# Patient Record
Sex: Male | Born: 1945 | Race: White | Hispanic: No | Marital: Married | Smoking: Former smoker
Health system: Southern US, Community
[De-identification: ages and names within clinical notes are randomized; demographics above are authoritative.]

## PROBLEM LIST (undated history)

## (undated) DIAGNOSIS — Z794 Long term (current) use of insulin: Secondary | ICD-10-CM

## (undated) DIAGNOSIS — G4730 Sleep apnea, unspecified: Secondary | ICD-10-CM

## (undated) DIAGNOSIS — J449 Chronic obstructive pulmonary disease, unspecified: Secondary | ICD-10-CM

## (undated) DIAGNOSIS — E119 Type 2 diabetes mellitus without complications: Secondary | ICD-10-CM

## (undated) DIAGNOSIS — L0390 Cellulitis, unspecified: Secondary | ICD-10-CM

## (undated) DIAGNOSIS — N179 Acute kidney failure, unspecified: Secondary | ICD-10-CM

## (undated) DIAGNOSIS — I10 Essential (primary) hypertension: Secondary | ICD-10-CM

## (undated) DIAGNOSIS — N289 Disorder of kidney and ureter, unspecified: Secondary | ICD-10-CM

## (undated) DIAGNOSIS — Z77098 Contact with and (suspected) exposure to other hazardous, chiefly nonmedicinal, chemicals: Secondary | ICD-10-CM

## (undated) DIAGNOSIS — E785 Hyperlipidemia, unspecified: Secondary | ICD-10-CM

## (undated) HISTORY — DX: Long term (current) use of insulin: Z79.4

## (undated) HISTORY — DX: Type 2 diabetes mellitus without complications: E11.9

## (undated) HISTORY — DX: Contact with and (suspected) exposure to other hazardous, chiefly nonmedicinal, chemicals: Z77.098

## (undated) HISTORY — DX: Hyperlipidemia, unspecified: E78.5

## (undated) HISTORY — DX: Cellulitis, unspecified: L03.90

## (undated) HISTORY — PX: APPENDECTOMY: SHX54

---

## 2005-09-20 ENCOUNTER — Encounter: Admission: RE | Admit: 2005-09-20 | Discharge: 2005-09-20 | Payer: Self-pay | Admitting: Family Medicine

## 2006-02-17 ENCOUNTER — Ambulatory Visit: Payer: Self-pay | Admitting: Family Medicine

## 2006-03-12 ENCOUNTER — Ambulatory Visit: Payer: Self-pay | Admitting: Family Medicine

## 2006-03-26 ENCOUNTER — Ambulatory Visit: Payer: Self-pay | Admitting: Family Medicine

## 2006-07-30 ENCOUNTER — Ambulatory Visit: Payer: Self-pay | Admitting: Internal Medicine

## 2006-10-06 ENCOUNTER — Ambulatory Visit: Payer: Self-pay | Admitting: Family Medicine

## 2007-08-06 ENCOUNTER — Telehealth (INDEPENDENT_AMBULATORY_CARE_PROVIDER_SITE_OTHER): Payer: Self-pay | Admitting: *Deleted

## 2007-08-27 ENCOUNTER — Telehealth (INDEPENDENT_AMBULATORY_CARE_PROVIDER_SITE_OTHER): Payer: Self-pay | Admitting: *Deleted

## 2007-08-31 ENCOUNTER — Telehealth (INDEPENDENT_AMBULATORY_CARE_PROVIDER_SITE_OTHER): Payer: Self-pay | Admitting: *Deleted

## 2007-09-01 ENCOUNTER — Telehealth (INDEPENDENT_AMBULATORY_CARE_PROVIDER_SITE_OTHER): Payer: Self-pay | Admitting: *Deleted

## 2007-09-09 ENCOUNTER — Ambulatory Visit: Payer: Self-pay | Admitting: Family Medicine

## 2007-09-09 DIAGNOSIS — K219 Gastro-esophageal reflux disease without esophagitis: Secondary | ICD-10-CM

## 2007-09-09 DIAGNOSIS — J45909 Unspecified asthma, uncomplicated: Secondary | ICD-10-CM | POA: Insufficient documentation

## 2007-09-09 DIAGNOSIS — E119 Type 2 diabetes mellitus without complications: Secondary | ICD-10-CM

## 2007-09-09 DIAGNOSIS — E669 Obesity, unspecified: Secondary | ICD-10-CM

## 2007-09-09 DIAGNOSIS — E785 Hyperlipidemia, unspecified: Secondary | ICD-10-CM

## 2007-09-09 DIAGNOSIS — I10 Essential (primary) hypertension: Secondary | ICD-10-CM | POA: Insufficient documentation

## 2007-09-10 ENCOUNTER — Telehealth (INDEPENDENT_AMBULATORY_CARE_PROVIDER_SITE_OTHER): Payer: Self-pay | Admitting: *Deleted

## 2008-05-12 ENCOUNTER — Ambulatory Visit: Payer: Self-pay | Admitting: Internal Medicine

## 2008-05-12 DIAGNOSIS — L02219 Cutaneous abscess of trunk, unspecified: Secondary | ICD-10-CM | POA: Insufficient documentation

## 2008-05-12 DIAGNOSIS — D239 Other benign neoplasm of skin, unspecified: Secondary | ICD-10-CM | POA: Insufficient documentation

## 2008-05-12 DIAGNOSIS — L03319 Cellulitis of trunk, unspecified: Secondary | ICD-10-CM

## 2008-06-08 ENCOUNTER — Encounter: Payer: Self-pay | Admitting: Internal Medicine

## 2008-08-23 ENCOUNTER — Encounter: Payer: Self-pay | Admitting: Internal Medicine

## 2008-10-12 ENCOUNTER — Ambulatory Visit: Payer: Self-pay

## 2008-10-12 ENCOUNTER — Ambulatory Visit: Payer: Self-pay | Admitting: Family Medicine

## 2008-10-12 DIAGNOSIS — L03119 Cellulitis of unspecified part of limb: Secondary | ICD-10-CM | POA: Insufficient documentation

## 2008-10-14 ENCOUNTER — Ambulatory Visit: Payer: Self-pay | Admitting: Family Medicine

## 2008-10-15 ENCOUNTER — Ambulatory Visit: Payer: Self-pay | Admitting: Family Medicine

## 2008-10-17 ENCOUNTER — Ambulatory Visit: Payer: Self-pay | Admitting: Family Medicine

## 2008-10-19 ENCOUNTER — Ambulatory Visit: Payer: Self-pay | Admitting: Family Medicine

## 2008-10-20 ENCOUNTER — Telehealth (INDEPENDENT_AMBULATORY_CARE_PROVIDER_SITE_OTHER): Payer: Self-pay | Admitting: *Deleted

## 2008-10-22 ENCOUNTER — Telehealth (INDEPENDENT_AMBULATORY_CARE_PROVIDER_SITE_OTHER): Payer: Self-pay | Admitting: *Deleted

## 2008-10-22 ENCOUNTER — Emergency Department (HOSPITAL_BASED_OUTPATIENT_CLINIC_OR_DEPARTMENT_OTHER): Admission: EM | Admit: 2008-10-22 | Discharge: 2008-10-22 | Payer: Self-pay | Admitting: Emergency Medicine

## 2008-10-24 ENCOUNTER — Ambulatory Visit: Payer: Self-pay | Admitting: Family Medicine

## 2008-10-25 ENCOUNTER — Encounter (INDEPENDENT_AMBULATORY_CARE_PROVIDER_SITE_OTHER): Payer: Self-pay | Admitting: *Deleted

## 2008-10-25 ENCOUNTER — Ambulatory Visit: Payer: Self-pay | Admitting: Internal Medicine

## 2008-10-25 ENCOUNTER — Telehealth: Payer: Self-pay | Admitting: Family Medicine

## 2008-10-25 LAB — CONVERTED CEMR LAB: OCCULT 1: NEGATIVE

## 2008-10-27 ENCOUNTER — Ambulatory Visit: Payer: Self-pay | Admitting: Family Medicine

## 2008-10-31 ENCOUNTER — Encounter: Payer: Self-pay | Admitting: Family Medicine

## 2008-11-01 ENCOUNTER — Ambulatory Visit: Payer: Self-pay | Admitting: Family Medicine

## 2008-11-02 ENCOUNTER — Telehealth (INDEPENDENT_AMBULATORY_CARE_PROVIDER_SITE_OTHER): Payer: Self-pay | Admitting: *Deleted

## 2008-11-14 ENCOUNTER — Encounter (INDEPENDENT_AMBULATORY_CARE_PROVIDER_SITE_OTHER): Payer: Self-pay | Admitting: *Deleted

## 2008-11-14 ENCOUNTER — Ambulatory Visit: Payer: Self-pay | Admitting: Family Medicine

## 2008-11-14 DIAGNOSIS — D649 Anemia, unspecified: Secondary | ICD-10-CM | POA: Insufficient documentation

## 2008-11-16 ENCOUNTER — Encounter (INDEPENDENT_AMBULATORY_CARE_PROVIDER_SITE_OTHER): Payer: Self-pay | Admitting: *Deleted

## 2008-11-16 ENCOUNTER — Telehealth (INDEPENDENT_AMBULATORY_CARE_PROVIDER_SITE_OTHER): Payer: Self-pay | Admitting: *Deleted

## 2008-11-16 LAB — CONVERTED CEMR LAB
Basophils Absolute: 0 10*3/uL (ref 0.0–0.1)
Basophils Relative: 0.1 % (ref 0.0–3.0)
Eosinophils Relative: 13.8 % — ABNORMAL HIGH (ref 0.0–5.0)
Iron: 130 ug/dL (ref 42–165)
Lymphocytes Relative: 14.2 % (ref 12.0–46.0)
MCV: 87.8 fL (ref 78.0–100.0)
Monocytes Absolute: 0.6 10*3/uL (ref 0.1–1.0)
Neutrophils Relative %: 61 % (ref 43.0–77.0)
RBC: 4.68 M/uL (ref 4.22–5.81)
Saturation Ratios: 33.8 % (ref 20.0–50.0)
WBC: 5.2 10*3/uL (ref 4.5–10.5)

## 2008-11-23 ENCOUNTER — Ambulatory Visit: Payer: Self-pay | Admitting: Family Medicine

## 2008-12-05 ENCOUNTER — Encounter: Payer: Self-pay | Admitting: Family Medicine

## 2008-12-08 ENCOUNTER — Ambulatory Visit (HOSPITAL_BASED_OUTPATIENT_CLINIC_OR_DEPARTMENT_OTHER): Admission: RE | Admit: 2008-12-08 | Discharge: 2008-12-08 | Payer: Self-pay | Admitting: Family Medicine

## 2008-12-08 ENCOUNTER — Ambulatory Visit: Payer: Self-pay | Admitting: Radiology

## 2008-12-08 ENCOUNTER — Telehealth (INDEPENDENT_AMBULATORY_CARE_PROVIDER_SITE_OTHER): Payer: Self-pay | Admitting: *Deleted

## 2008-12-08 ENCOUNTER — Ambulatory Visit: Payer: Self-pay | Admitting: Family Medicine

## 2008-12-08 DIAGNOSIS — J209 Acute bronchitis, unspecified: Secondary | ICD-10-CM | POA: Insufficient documentation

## 2008-12-15 ENCOUNTER — Ambulatory Visit: Payer: Self-pay | Admitting: Family Medicine

## 2008-12-15 DIAGNOSIS — R609 Edema, unspecified: Secondary | ICD-10-CM

## 2008-12-19 ENCOUNTER — Telehealth (INDEPENDENT_AMBULATORY_CARE_PROVIDER_SITE_OTHER): Payer: Self-pay | Admitting: *Deleted

## 2008-12-19 LAB — CONVERTED CEMR LAB
BUN: 30 mg/dL — ABNORMAL HIGH (ref 6–23)
Chloride: 101 meq/L (ref 96–112)
Glucose, Bld: 141 mg/dL — ABNORMAL HIGH (ref 70–99)

## 2008-12-22 ENCOUNTER — Encounter: Payer: Self-pay | Admitting: Family Medicine

## 2008-12-27 ENCOUNTER — Telehealth: Payer: Self-pay | Admitting: Family Medicine

## 2009-01-09 ENCOUNTER — Encounter: Payer: Self-pay | Admitting: Family Medicine

## 2009-01-09 ENCOUNTER — Encounter (HOSPITAL_BASED_OUTPATIENT_CLINIC_OR_DEPARTMENT_OTHER): Admission: RE | Admit: 2009-01-09 | Discharge: 2009-02-07 | Payer: Self-pay | Admitting: Internal Medicine

## 2009-01-10 ENCOUNTER — Encounter: Payer: Self-pay | Admitting: Family Medicine

## 2009-01-12 ENCOUNTER — Encounter: Payer: Self-pay | Admitting: Family Medicine

## 2009-01-18 ENCOUNTER — Encounter: Payer: Self-pay | Admitting: Family Medicine

## 2009-01-24 ENCOUNTER — Encounter: Payer: Self-pay | Admitting: Family Medicine

## 2009-02-06 ENCOUNTER — Telehealth: Payer: Self-pay | Admitting: Family Medicine

## 2009-02-15 ENCOUNTER — Ambulatory Visit: Payer: Self-pay | Admitting: Family Medicine

## 2009-02-15 LAB — CONVERTED CEMR LAB
Blood in Urine, dipstick: NEGATIVE
Glucose, Urine, Semiquant: >=1000
Ketones, urine, test strip: NEGATIVE
Specific Gravity, Urine: 1.015
WBC Urine, dipstick: NEGATIVE
pH: 6

## 2009-02-20 ENCOUNTER — Telehealth (INDEPENDENT_AMBULATORY_CARE_PROVIDER_SITE_OTHER): Payer: Self-pay | Admitting: *Deleted

## 2009-02-27 ENCOUNTER — Telehealth (INDEPENDENT_AMBULATORY_CARE_PROVIDER_SITE_OTHER): Payer: Self-pay | Admitting: *Deleted

## 2009-03-01 ENCOUNTER — Telehealth: Payer: Self-pay | Admitting: Family Medicine

## 2009-03-08 ENCOUNTER — Ambulatory Visit: Payer: Self-pay | Admitting: Family Medicine

## 2009-03-13 ENCOUNTER — Telehealth: Payer: Self-pay | Admitting: Family Medicine

## 2009-03-14 LAB — CONVERTED CEMR LAB
AST: 18 units/L (ref 0–37)
Albumin: 3.7 g/dL (ref 3.5–5.2)
BUN: 25 mg/dL — ABNORMAL HIGH (ref 6–23)
Bilirubin, Direct: 0.1 mg/dL (ref 0.0–0.3)
Calcium: 9.6 mg/dL (ref 8.4–10.5)
Creatinine,U: 116.3 mg/dL
HDL: 33 mg/dL — ABNORMAL LOW (ref >39.00)
Hgb A1c MFr Bld: 11.2 % — ABNORMAL HIGH (ref 4.6–6.5)
Microalb, Ur: 2.6 mg/dL — ABNORMAL HIGH (ref 0.0–1.9)
Total CHOL/HDL Ratio: 4
VLDL: 23 mg/dL (ref 0.0–40.0)

## 2009-03-27 ENCOUNTER — Encounter (INDEPENDENT_AMBULATORY_CARE_PROVIDER_SITE_OTHER): Payer: Self-pay | Admitting: *Deleted

## 2009-07-04 ENCOUNTER — Ambulatory Visit: Payer: Self-pay | Admitting: Family Medicine

## 2009-10-20 ENCOUNTER — Ambulatory Visit: Payer: Self-pay | Admitting: Family Medicine

## 2009-10-23 ENCOUNTER — Telehealth: Payer: Self-pay | Admitting: Family Medicine

## 2009-12-15 ENCOUNTER — Ambulatory Visit: Payer: Self-pay | Admitting: Family Medicine

## 2009-12-15 LAB — CONVERTED CEMR LAB: Glucose, Bld: 79 mg/dL

## 2010-01-20 IMAGING — CR DG TIBIA/FIBULA 2V*L*
3 series · 3 of 3 positions shown · non-contrast
Comparison: None

CLINICAL DATA: Left lower leg pain

LEFT TIBIA AND FIBULA - 2 VIEW

[view not recorded (1 of 3)]
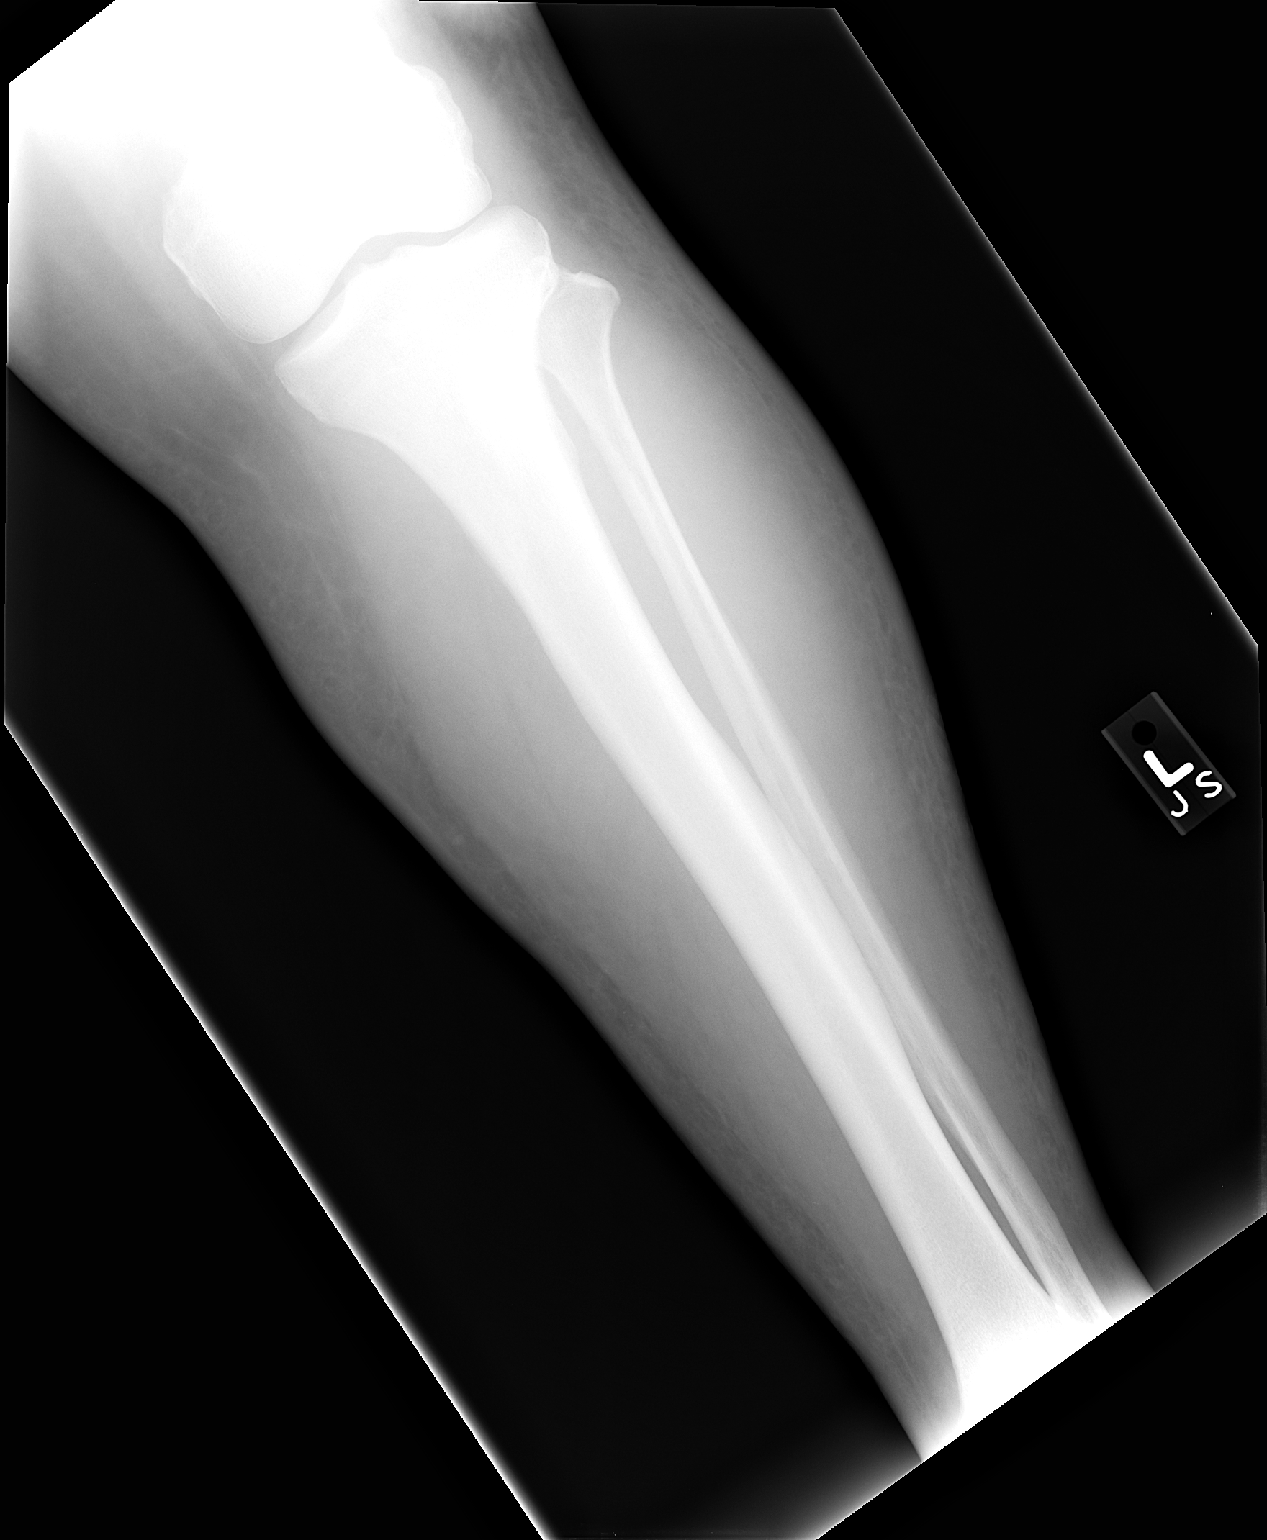

[view not recorded (2 of 3)]
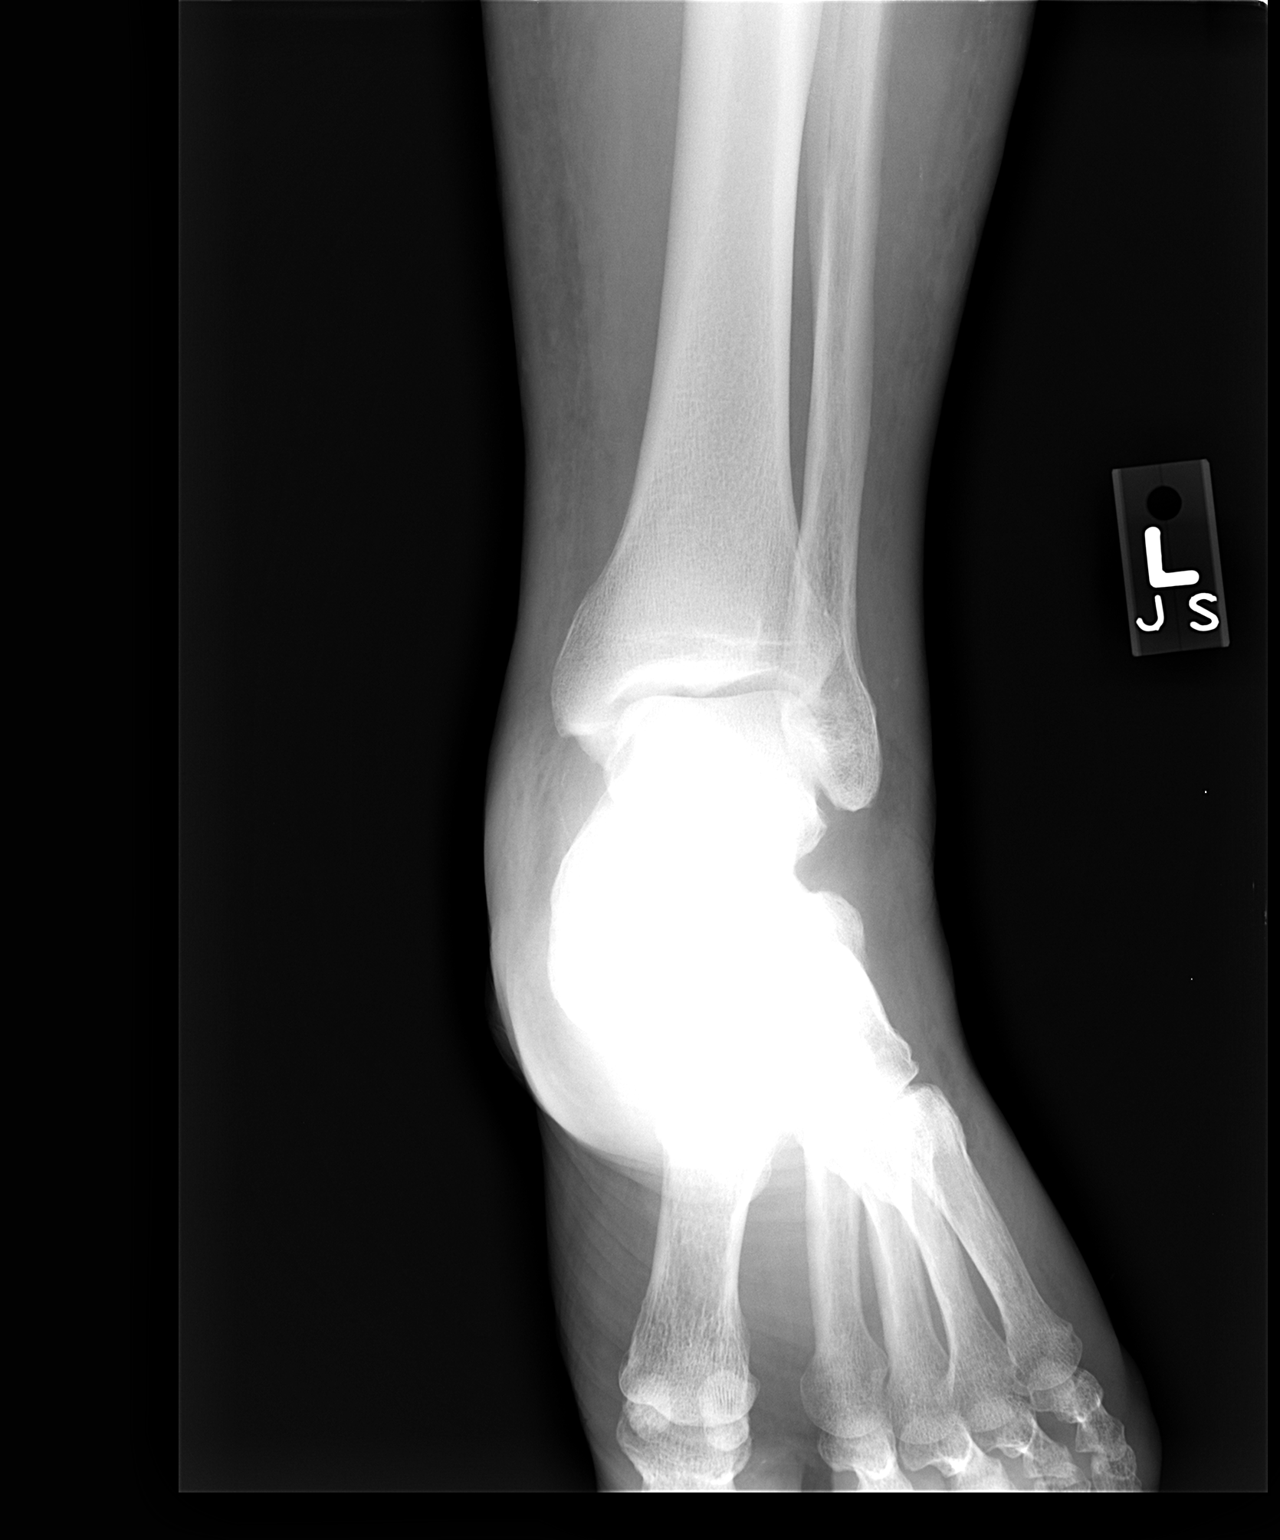

[view not recorded (3 of 3)]
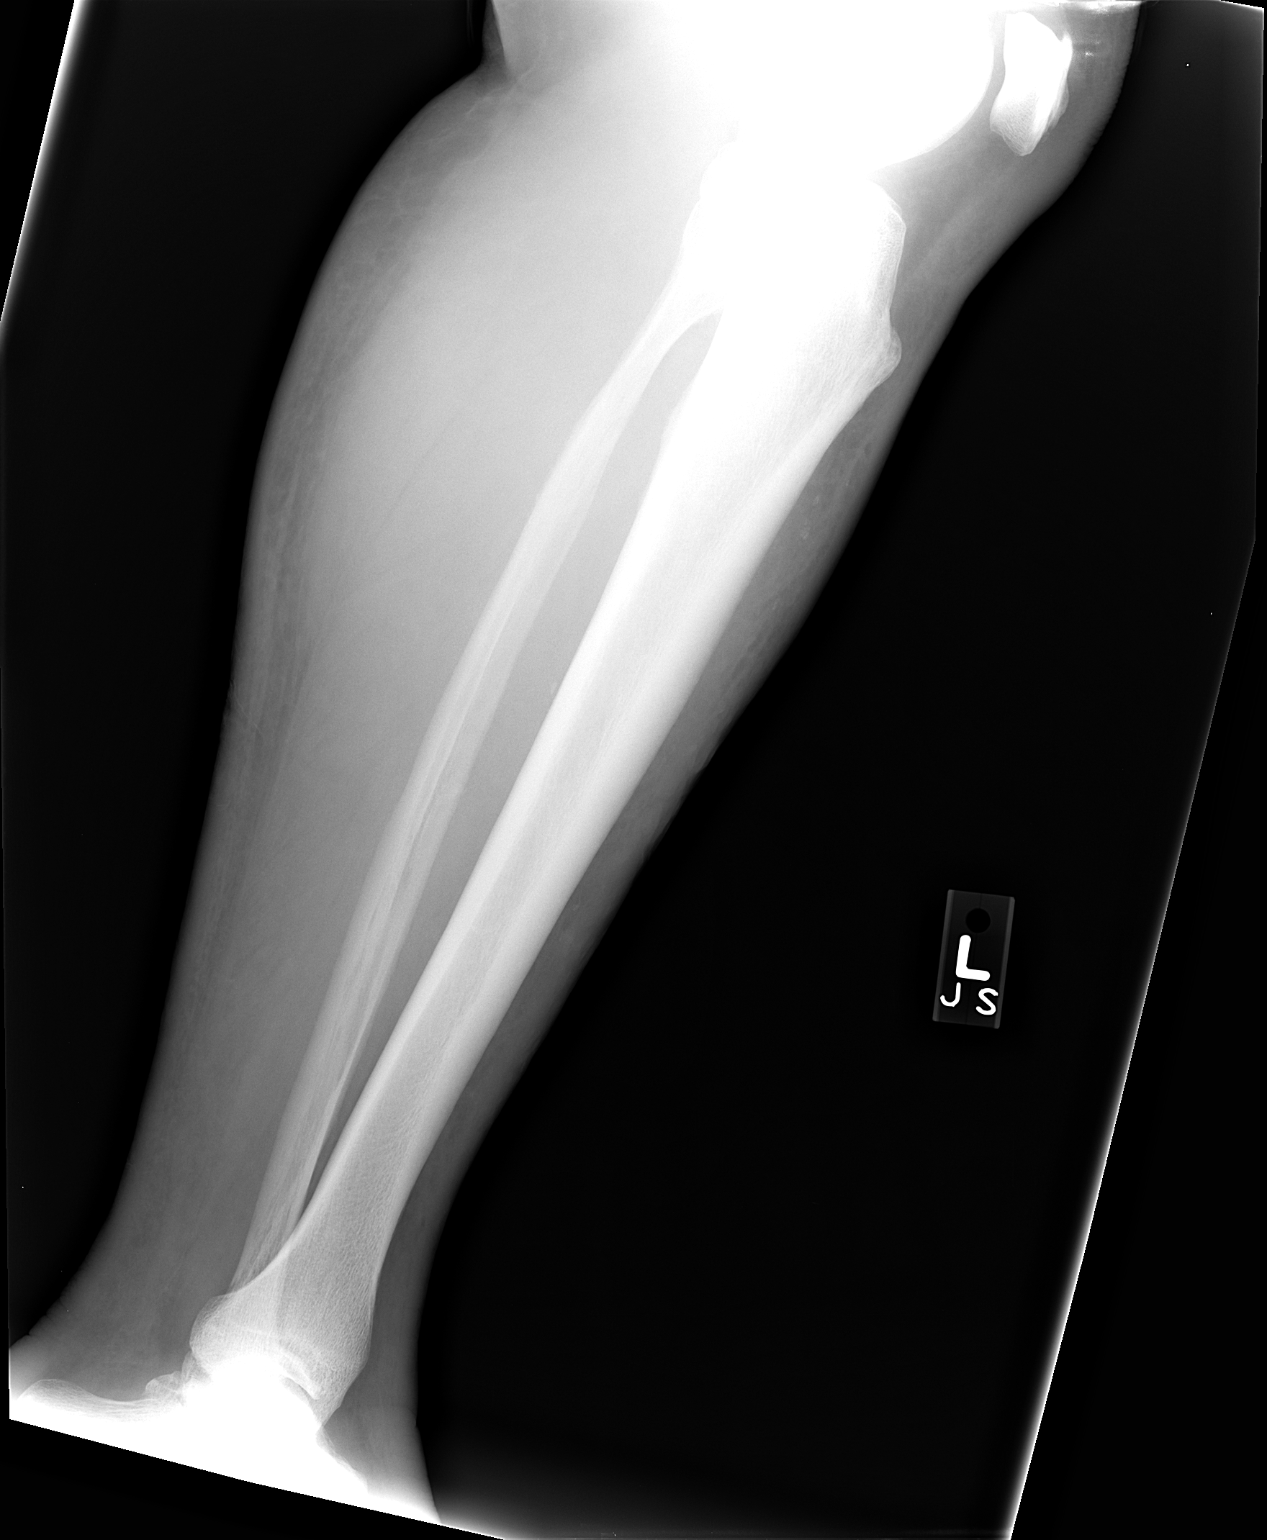

[3 of 3 positions shown; findings below may reference images not displayed]

FINDINGS: No acute bony abnormality is seen.  There is some soft
tissue swelling particularly over the anterior proximal left tibia.
Alignment is normal.
IMPRESSION: No bony abnormality.  Soft tissue swelling is noted above.

## 2010-08-28 NOTE — Progress Notes (Signed)
Summary: refill  Phone Note Refill Request Message from:  Fax from Pharmacy on October 23, 2009 4:50 PM  Refills Requested: Medication #1:  CHERATUSSIN AC 100-10 MG/5ML SYRP 1-2 tsp by mouth at bedtime as needed [address] fax [phone]   Method Requested: Fax to Local Pharmacy Next Appointment Scheduled: no appt Initial call taken by: Barb Merino,  October 23, 2009 4:51 PM  Follow-up for Phone Call        Pt was seen on 10/20/09 for a bad cough, he had meds at home but he states he is out now. Army Fossa CMA  October 24, 2009 8:11 AM   Additional Follow-up for Phone Call Additional follow up Details #1::        ok to refill x1 Additional Follow-up by: Loreen Freud DO,  October 24, 2009 10:52 AM    Prescriptions: CHERATUSSIN AC 100-10 MG/5ML SYRP (GUAIFENESIN-CODEINE) 1-2 tsp by mouth at bedtime as needed  #6 oz x 0   Entered by:   Army Fossa CMA   Authorized by:   Loreen Freud DO   Signed by:   Army Fossa CMA on 10/24/2009   Method used:   Printed then faxed to ...       [facility] (retail)       [address]       [place], [place]  [address]       Ph: [phone]       Fax: [phone]   RxID:   [id number]

## 2010-08-28 NOTE — Assessment & Plan Note (Signed)
Summary: sore on back--acute only--tl   Vital Signs:  Patient Profile:   65 Years Old Male Weight:      259 pounds Temp:     97.5 degrees F oral Pulse rate:   60 / minute Resp:     16 per minute BP sitting:   118 / 70  (left arm) Cuff size:   large  Pt. in pain?   no  Vitals Entered By: Shonna Chock (May 12, 2008 1:23 PM)                  Chief Complaint:  Sore itchy area on back x 3-4 weeks .  History of Present Illness: No trigger or injury as prelude to skin lesion which presented as pruritis. No PMH of MRSA. 50 # weight loss with Optifast through Dr Lenora Boys, [place] [place].His [facility] 8[facility]& was 5.8%. Metformin D/Ced 2 weeks ago; still on Glipizide & generic Avandia. No PMH of melanoma or FH of skin CA. On partial disability for DM & glaucoma ? due to Agent [employer] Emery ([place], [place] [id number])    Current Allergies (reviewed today): No known allergies     Risk Factors:  Tobacco use:  quit    Year quit:  age 34    Type:  beer Exercise:  yes    Times per week:  3    Type:  Walking    Physical Exam  General:     in no acute distress; alert,appropriate and cooperative throughout examination;overweight-appearing.   Skin:     Myriad of nevi ;excoriated ,mildly inflammed lesion L upper back Cervical Nodes:     No lymphadenopathy noted Axillary Nodes:     No palpable lymphadenopathy    Impression & Recommendations:  Problem # 1:  CELLULITIS, TRUNK (ICD-682.2) R/O squamous cell or basal cell CA with superficial infection Orders: Dermatology Referral (Derma)   Problem # 2:  NEVI, MULTIPLE (ICD-216.9)  Orders: Dermatology Referral (Derma)   Complete Medication List: 1)  Toprol Xl 50 Mg Xr24h-tab (Metoprolol succinate) .... 1/2 by mouth two times a day 2)  Felodipine 10 Mg Xr24h-tab (Felodipine) .Marland Kitchen.. 1 by mouth once daily 3)  Lisinopril 10 Mg Tabs (Lisinopril) .... 1/2 by mouth once daily 4)  Furosemide 20 Mg Tabs (Furosemide) .Marland Kitchen.. 1 by mouth  once daily 5)  Simvastatin 80 Mg Tabs (Simvastatin) .... 1/2 by mouth once daily 6)  Avandia 8 Mg Tabs (Rosiglitazone maleate) .Marland Kitchen.. 1 by mouth once daily 7)  Glipizide 5 Mg Tabs (Glipizide) .Marland Kitchen.. 1 by mouth once daily 8)  Bactroban 2 % Crea (Mupirocin calcium) .... Apply two times a day post cleansing   Patient Instructions: 1)  Meds as directed; keep Derm appt.   Prescriptions: BACTROBAN 2 % CREA (MUPIROCIN CALCIUM) apply two times a day post cleansing  #15 grams x 0   Entered and Authorized by:   Marga Melnick MD   Signed by:   Marga Melnick MD on 05/12/2008   Method used:   Print then Give to Patient   RxID:   [id number]  ]

## 2010-08-28 NOTE — Assessment & Plan Note (Signed)
Summary: bad cough//lch   Vital Signs:  Patient profile:   65 year old male Height:      65.5 inches Weight:      281 pounds BMI:     46.22 O2 Sat:      93 % on Room air Temp:     98.1 degrees F oral Pulse rate:   77 / minute Pulse rhythm:   regular BP sitting:   120 / 84  (left arm) Cuff size:   large  Vitals Entered By: Army Fossa CMA (October 20, 2009 11:25 AM)  O2 Flow:  Room air CC: Pt here c/o bad cough x 3 days. Has not tried any OTC., Cough   History of Present Illness:  Cough      This is a 65 year old man who presents with Cough.  The symptoms began 3 days ago.  The patient reports productive cough and wheezing, but denies fever and hemoptysis.  The patient denies the following symptoms: cold/URI symptoms, sore throat, nasal congestion, chronic rhinitis, weight loss, acid reflux symptoms, and peripheral edema.  The cough is worse with activity and lying down.  Risk factors include history of asthma.    Current Medications (verified): 1)  Toprol Xl 50 Mg Xr24h-Tab (Metoprolol Succinate) .... 1/2 By Mouth Two Times A Day 2)  Felodipine 10 Mg Xr24h-Tab (Felodipine) .Marland Kitchen.. 1 By Mouth Once Daily 3)  Lisinopril 10 Mg Tabs (Lisinopril) .... 1/2 By Mouth Once Daily 4)  Lasix 40 Mg Tabs (Furosemide) .Marland Kitchen.. 1 By Mouth Once Daily 5)  Simvastatin 80 Mg Tabs (Simvastatin) .Marland Kitchen.. 1  By Mouth Once Daily 6)  Proventil Hfa 108 (90 Base) Mcg/act Aers (Albuterol Sulfate) .... Uses For Wheezing For Allergies. 7)  Designer, multimedia W/device Kit (Blood Glucose Monitoring Suppl) .... Test Two Times A Day 8)  Bayer Contour Test  Strp (Glucose Blood) .... Test Two Times A Day 9)  Bayer Microlet Lancets  Misc (Lancets) .... Test Two Times A Day 10)  Victoza 18 Mg/12ml Soln (Liraglutide) .... 1.8 Mg Subcutaneously Once Daily 11)  Glucophage 1000 Mg Tabs (Metformin Hcl) .Marland Kitchen.. 1 By Mouth Two Times A Day 12)  Lantus 100 Unit/ml Soln (Insulin Glargine) 13)  Cheratussin Ac 100-10 Mg/65ml Syrp  (Guaifenesin-Codeine) .Marland Kitchen.. 1-2 Tsp By Mouth At Bedtime As Needed 14)  Augmentin 875-125 Mg Tabs (Amoxicillin-Pot Clavulanate) .Marland Kitchen.. 1 By Mouth Two Times A Day 15)  Advair Hfa 115-21 Mcg/act Aero (Fluticasone-Salmeterol) .... 2 Puffs Two Times A Day  Allergies (verified): No Known Drug Allergies  Physical Exam  General:  Well-developed,well-nourished,in no acute distress; alert,appropriate and cooperative throughout examination Lungs:  R wheezes and L wheezes.   Heart:  normal rate and no murmur.   Cervical Nodes:  No lymphadenopathy noted Psych:  Oriented X3 and normally interactive.     Impression & Recommendations:  Problem # 1:  ASTHMATIC BRONCHITIS, ACUTE (ICD-466.0)  His updated medication list for this problem includes:    Proventil Hfa 108 (90 Base) Mcg/act Aers (Albuterol sulfate) ..... Uses for wheezing for allergies.    Cheratussin Ac 100-10 Mg/64ml Syrp (Guaifenesin-codeine) .Marland Kitchen... 1-2 tsp by mouth at bedtime as needed    Augmentin 875-125 Mg Tabs (Amoxicillin-pot clavulanate) .Marland Kitchen... 1 by mouth two times a day    Advair Hfa 115-21 Mcg/act Aero (Fluticasone-salmeterol) .Marland Kitchen... 2 puffs two times a day  Take antibiotics and other medications as directed. Encouraged to push clear liquids, get enough rest, and take acetaminophen as needed. To be seen in  5-7 days if no improvement, sooner if worse.  Complete Medication List: 1)  Toprol Xl 50 Mg Xr24h-tab (Metoprolol succinate) .... 1/2 by mouth two times a day 2)  Felodipine 10 Mg Xr24h-tab (Felodipine) .Marland Kitchen.. 1 by mouth once daily 3)  Lisinopril 10 Mg Tabs (Lisinopril) .Marland Kitchen.. 1 by mouth once daily 4)  Lasix 40 Mg Tabs (Furosemide) .Marland Kitchen.. 1 by mouth once daily 5)  Simvastatin 80 Mg Tabs (Simvastatin) .... 1/2  by mouth once daily 6)  Proventil Hfa 108 (90 Base) Mcg/act Aers (Albuterol sulfate) .... Uses for wheezing for allergies. 7)  Designer, multimedia W/device Kit (Blood glucose monitoring suppl) .... Test two times a day 8)  Bayer  Contour Test Strp (Glucose blood) .... Test two times a day 9)  Bayer Microlet Lancets Misc (Lancets) .... Test two times a day 10)  Victoza 18 Mg/96ml Soln (Liraglutide) .... 1.8 mg subcutaneously once daily 11)  Glucophage 1000 Mg Tabs (Metformin hcl) .Marland Kitchen.. 1 by mouth two times a day 12)  Lantus 100 Unit/ml Soln (Insulin glargine) 13)  Cheratussin Ac 100-10 Mg/41ml Syrp (Guaifenesin-codeine) .Marland Kitchen.. 1-2 tsp by mouth at bedtime as needed 14)  Augmentin 875-125 Mg Tabs (Amoxicillin-pot clavulanate) .Marland Kitchen.. 1 by mouth two times a day 15)  Advair Hfa 115-21 Mcg/act Aero (Fluticasone-salmeterol) .... 2 puffs two times a day Prescriptions: AUGMENTIN 875-125 MG TABS (AMOXICILLIN-POT CLAVULANATE) 1 by mouth two times a day  #20 x 0   Entered and Authorized by:   Loreen Freud DO   Signed by:   Loreen Freud DO on 10/20/2009   Method used:   Electronically to        [facility] (retail)       [address]       [place], [place]  [address]       Ph: [phone]       Fax: [phone]   RxID:   [id number]

## 2010-08-28 NOTE — Assessment & Plan Note (Signed)
Summary: eyeirritated/cbs   Vital Signs:  Patient profile:   65 year old male Weight:      285 pounds Pulse rate:   70 / minute BP sitting:   130 / 82  (left arm)  Vitals Entered By: Doristine Devoid (Dec 15, 2009 11:31 AM) CC: both eyes red and irritated some itching also w/ cough    History of Present Illness: 65 yo man here today for red eyes.  sxs started 'a couple days ago'.  sxs started w/ coughing and then progressed into the eyes.  eyes are matted in the AM, thin watery drainage from eyes during the day.  some nasal congestion, intermittant cough.  'not bronchitis like last time'.  denies any recent work w/ wood, sawdust, metal shavings, or possible foreign body in eye  Current Medications (verified): 1)  Toprol Xl 50 Mg Xr24h-Tab (Metoprolol Succinate) .... 1/2 By Mouth Two Times A Day 2)  Felodipine 10 Mg Xr24h-Tab (Felodipine) .Marland Kitchen.. 1 By Mouth Once Daily 3)  Lisinopril 10 Mg Tabs (Lisinopril) .Marland Kitchen.. 1 By Mouth Once Daily 4)  Lasix 40 Mg Tabs (Furosemide) .Marland Kitchen.. 1 By Mouth Once Daily 5)  Simvastatin 80 Mg Tabs (Simvastatin) .... 1/2  By Mouth Once Daily 6)  Proventil Hfa 108 (90 Base) Mcg/act Aers (Albuterol Sulfate) .... Uses For Wheezing For Allergies. 7)  Designer, multimedia W/device Kit (Blood Glucose Monitoring Suppl) .... Test Two Times A Day 8)  Bayer Contour Test  Strp (Glucose Blood) .... Test Two Times A Day 9)  Bayer Microlet Lancets  Misc (Lancets) .... Test Two Times A Day 10)  Lantus 100 Unit/ml Soln (Insulin Glargine) 11)  Cheratussin Ac 100-10 Mg/82ml Syrp (Guaifenesin-Codeine) .Marland Kitchen.. 1-2 Tsp By Mouth At Bedtime As Needed 12)  Advair Hfa 115-21 Mcg/act Aero (Fluticasone-Salmeterol) .... 2 Puffs Two Times A Day 13)  Timoptic 0.5 % Soln (Timolol Maleate) 14)  Vigamox 0.5 % Soln (Moxifloxacin Hcl) .Marland Kitchen.. 1 Drop Three Times A Day X7 Days  Allergies (verified): No Known Drug Allergies  Review of Systems      See HPI  Physical Exam  General:   Well-developed,well-nourished,in no acute distress; alert,appropriate and cooperative throughout examination Head:  NCAT, no TTP over sinuses Eyes:  + injxn, inflammation of both eyes w/ clear, watery drainage Ears:  External ear exam shows no significant lesions or deformities.  Otoscopic examination reveals clear canals, tympanic membranes are intact bilaterally without bulging, retraction, inflammation or discharge. Hearing is grossly normal bilaterally. Nose:  no external deformity, mucosal erythema Mouth:  Oral mucosa and oropharynx without lesions or exudates.  Teeth in good repair. Neck:  No deformities, masses, or tenderness noted. Lungs:  Normal respiratory effort, chest expands symmetrically. Lungs are clear to auscultation, no crackles or wheezes. Heart:  normal rate and no murmur.     Impression & Recommendations:  Problem # 1:  CONJUNCTIVITIS (ICD-372.30) Assessment New  both eyes involved.  difficult to determine allergic vs viral vs bacterial.  given watery discharge likely allergic or viral but will start meds for possible bacterial infxn.  reviewed supportive care and red flags that should prompt return.  Pt expresses understanding and is in agreement w/ this plan. His updated medication list for this problem includes:    Vigamox 0.5 % Soln (Moxifloxacin hcl) .Marland Kitchen... 1 drop three times a day x7 days  Orders: Prescription Created Electronically [phone])  Problem # 2:  DIABETES MELLITUS, TYPE II (ICD-250.00) Assessment: Unchanged pt took insulin this AM w/out eating.  started  feeling shakey.  given crackers in office. The following medications were removed from the medication list:    Victoza 18 Mg/85ml Soln (Liraglutide) .Marland Kitchen... 1.8 mg subcutaneously once daily    Glucophage 1000 Mg Tabs (Metformin hcl) .Marland Kitchen... 1 by mouth two times a day His updated medication list for this problem includes:    Lisinopril 10 Mg Tabs (Lisinopril) .Marland Kitchen... 1 by mouth once daily    Lantus 100 Unit/ml  Soln (Insulin glargine)  Orders: Fingerstick ([address]) Glucose, (CBG) ([address])  Complete Medication List: 1)  Toprol Xl 50 Mg Xr24h-tab (Metoprolol succinate) .... 1/2 by mouth two times a day 2)  Felodipine 10 Mg Xr24h-tab (Felodipine) .Marland Kitchen.. 1 by mouth once daily 3)  Lisinopril 10 Mg Tabs (Lisinopril) .Marland Kitchen.. 1 by mouth once daily 4)  Lasix 40 Mg Tabs (Furosemide) .Marland Kitchen.. 1 by mouth once daily 5)  Simvastatin 80 Mg Tabs (Simvastatin) .... 1/2  by mouth once daily 6)  Proventil Hfa 108 (90 Base) Mcg/act Aers (Albuterol sulfate) .... Uses for wheezing for allergies. 7)  Designer, multimedia W/device Kit (Blood glucose monitoring suppl) .... Test two times a day 8)  Bayer Contour Test Strp (Glucose blood) .... Test two times a day 9)  Bayer Microlet Lancets Misc (Lancets) .... Test two times a day 10)  Lantus 100 Unit/ml Soln (Insulin glargine) 11)  Cheratussin Ac 100-10 Mg/53ml Syrp (Guaifenesin-codeine) .Marland Kitchen.. 1-2 tsp by mouth at bedtime as needed 12)  Advair Hfa 115-21 Mcg/act Aero (Fluticasone-salmeterol) .... 2 puffs two times a day 13)  Timoptic 0.5 % Soln (Timolol maleate) 14)  Vigamox 0.5 % Soln (Moxifloxacin hcl) .Marland Kitchen.. 1 drop three times a day x7 days  Patient Instructions: 1)  Use the Vigamox eye drops as directed for pink eye 2)  Start Zyrtec (over the counter) for the seasonal allergy component 3)  Drink plenty of fluids 4)  Call with any questions or concerns- or if your eyes aren't improving after 7 days 5)  Hang in there! Prescriptions: VIGAMOX 0.5 % SOLN (MOXIFLOXACIN HCL) 1 drop three times a day x7 days  #3 ml x 0   Entered and Authorized by:   Neena Rhymes MD   Signed by:   Neena Rhymes MD on 12/15/2009   Method used:   Electronically to        [facility] (retail)       [address]       [place], [place]  [address]       Ph: [phone]       Fax: [phone]   RxID:   [id number]   Laboratory Results   Blood  Tests     Glucose (random): 79 mg/dL   (Normal Range: 27-253)

## 2010-10-30 ENCOUNTER — Ambulatory Visit (HOSPITAL_COMMUNITY)
Admission: RE | Admit: 2010-10-30 | Discharge: 2010-10-30 | Disposition: A | Discharge status: Home / Self Care | Payer: Medicare Other | Source: Ambulatory Visit | Attending: Internal Medicine | Admitting: Internal Medicine

## 2010-10-30 DIAGNOSIS — M7989 Other specified soft tissue disorders: Secondary | ICD-10-CM | POA: Insufficient documentation

## 2010-10-30 DIAGNOSIS — L0390 Cellulitis, unspecified: Secondary | ICD-10-CM | POA: Insufficient documentation

## 2010-10-30 DIAGNOSIS — L0291 Cutaneous abscess, unspecified: Secondary | ICD-10-CM | POA: Insufficient documentation

## 2010-11-08 LAB — CBC
HCT: 37.2 % — ABNORMAL LOW (ref 39.0–52.0)
Hemoglobin: 12.6 g/dL — ABNORMAL LOW (ref 13.0–17.0)
MCV: 91.2 fL (ref 78.0–100.0)
RBC: 4.09 MIL/uL — ABNORMAL LOW (ref 4.22–5.81)
RDW: 13.4 % (ref 11.5–15.5)
WBC: 9.2 10*3/uL (ref 4.0–10.5)

## 2010-11-08 LAB — DIFFERENTIAL
Basophils Absolute: 0.1 10*3/uL (ref 0.0–0.1)
Basophils Relative: 1 % (ref 0–1)
Eosinophils Absolute: 0.1 10*3/uL (ref 0.0–0.7)
Monocytes Relative: 7 % (ref 3–12)

## 2010-11-08 LAB — URINALYSIS, ROUTINE W REFLEX MICROSCOPIC
Bilirubin Urine: NEGATIVE
Nitrite: NEGATIVE
Urobilinogen, UA: 0.2 mg/dL (ref 0.0–1.0)
pH: 7 (ref 5.0–8.0)

## 2010-11-08 LAB — URINE CULTURE: Culture: NO GROWTH

## 2010-11-08 LAB — BASIC METABOLIC PANEL
Chloride: 99 mEq/L (ref 96–112)
Creatinine, Ser: 1.5 mg/dL (ref 0.4–1.5)
Potassium: 5.8 mEq/L — ABNORMAL HIGH (ref 3.5–5.1)

## 2010-11-08 LAB — GLUCOSE, CAPILLARY: Glucose-Capillary: 114 mg/dL — ABNORMAL HIGH (ref 70–99)

## 2010-12-10 ENCOUNTER — Encounter: Payer: Self-pay | Admitting: Infectious Disease

## 2010-12-10 ENCOUNTER — Ambulatory Visit (INDEPENDENT_AMBULATORY_CARE_PROVIDER_SITE_OTHER): Payer: Medicare Other | Admitting: Infectious Disease

## 2010-12-10 DIAGNOSIS — I8310 Varicose veins of unspecified lower extremity with inflammation: Secondary | ICD-10-CM

## 2010-12-10 DIAGNOSIS — I872 Venous insufficiency (chronic) (peripheral): Secondary | ICD-10-CM | POA: Insufficient documentation

## 2010-12-10 DIAGNOSIS — L0291 Cutaneous abscess, unspecified: Secondary | ICD-10-CM

## 2010-12-10 DIAGNOSIS — I739 Peripheral vascular disease, unspecified: Secondary | ICD-10-CM | POA: Insufficient documentation

## 2010-12-10 DIAGNOSIS — L0390 Cellulitis, unspecified: Secondary | ICD-10-CM

## 2010-12-10 DIAGNOSIS — B351 Tinea unguium: Secondary | ICD-10-CM

## 2010-12-10 LAB — CBC WITH DIFFERENTIAL/PLATELET
Eosinophils Absolute: 0.2 10*3/uL (ref 0.0–0.7)
HCT: 45.8 % (ref 39.0–52.0)
MCHC: 34.3 g/dL (ref 30.0–36.0)
MCV: 87.4 fL (ref 78.0–100.0)
Monocytes Absolute: 0.7 10*3/uL (ref 0.1–1.0)
Platelets: 192 10*3/uL (ref 150–400)
RDW: 13.5 % (ref 11.5–15.5)

## 2010-12-10 LAB — COMPREHENSIVE METABOLIC PANEL
AST: 29 U/L (ref 0–37)
Albumin: 4.2 g/dL (ref 3.5–5.2)
CO2: 24 mEq/L (ref 19–32)
Potassium: 4.4 mEq/L (ref 3.5–5.3)
Total Protein: 6.4 g/dL (ref 6.0–8.3)

## 2010-12-10 LAB — HIV ANTIBODY (ROUTINE TESTING W REFLEX): HIV: NONREACTIVE

## 2010-12-10 MED ORDER — SULFAMETHOXAZOLE-TRIMETHOPRIM 800-160 MG PO TABS: 2.0000 | ORAL_TABLET | Freq: Two times a day (BID) | ORAL | Status: AC

## 2010-12-10 NOTE — Progress Notes (Signed)
Subjective:    Patient ID: Andre Hunter, male    DOB: 1946/06/25, 65 y.o.   MRN: [id number]  HPI  65 yo with diabetes mellitus, venous stasis changes, PVD with problems with recurrent "cellulitis."  First episode was a year ago left leg and it improved with a "strong iv antibiotic." He then was doing well until he went to [place]. Was in [place] in [facility]  With right leg. He was given clindamycin and ciprofloxacin. He then developedl cellultis approximately 2 months ago. He was placed on cipro and clinda again with minimal improvement then put on cephalexin with unimpressive response. He has been on antibiotics for a proximate 2 months in total. Lungs are then tested for MRSA although don't know where he was this was sampled this was some superficial swab for nasal PCR test. His cellulitis is characterized by pain and erythema and also blistering. Blisters at times draining clear colored material. The patient denies having gone into any bodies of water such as erosions or legs prior to the onset of any of his episodes of cellulitis. He does not have any known liver disease as does not drink alcohol. Denies having fevers chills nausea malaise with any of his episodes of cellulitis. He has had is is a significant amount of pain especially with this his been taking a fair amount of blood over an hour with this patient including the time in counseling patient and clinician caring obtained and obtaining outside medical records to    Review of Systems  Constitutional: Negative for fever, chills, diaphoresis, activity change, appetite change, fatigue and unexpected weight change.  HENT: Negative for nosebleeds, congestion, facial swelling, rhinorrhea, sneezing, neck pain, neck stiffness, dental problem, postnasal drip and sinus pressure.   Eyes: Negative for pain, discharge, redness and itching.  Respiratory: Negative for cough, choking, chest tightness, shortness of breath, wheezing and stridor.     Cardiovascular: Negative for chest pain, palpitations and leg swelling.  Gastrointestinal: Negative for abdominal pain, constipation, blood in stool, abdominal distention and anal bleeding.  Genitourinary: Negative for dysuria and difficulty urinating.  Musculoskeletal: Positive for myalgias. Negative for back pain, joint swelling, arthralgias and gait problem.  Skin: Positive for color change. Negative for pallor and rash.  Neurological: Negative for dizziness, facial asymmetry, weakness and headaches.  Hematological: Negative for adenopathy. Bruises/bleeds easily.  Psychiatric/Behavioral: Negative for hallucinations, behavioral problems, confusion, dysphoric mood, decreased concentration and agitation.       Objective:   Physical Exam  Constitutional: He is oriented to person, place, and time. He appears well-developed and well-nourished. No distress.  HENT:  Head: Normocephalic and atraumatic.  Left Ear: External ear normal.  Mouth/Throat: No oropharyngeal exudate.  Eyes: Conjunctivae are normal. No scleral icterus.       At times when looking left and right he has disconjugate gaze this is been chronic.  Neck: Normal range of motion. Neck supple. No JVD present.  Cardiovascular: Normal rate, regular rhythm, normal heart sounds and intact distal pulses.  Exam reveals no gallop and no friction rub.   No murmur heard. Pulmonary/Chest: Effort normal and breath sounds normal. No respiratory distress. He has no wheezes. He has no rales. He exhibits no tenderness.  Abdominal: Soft. Bowel sounds are normal. He exhibits no distension. There is no tenderness. There is no rebound and no guarding.  Musculoskeletal: He exhibits edema and tenderness.  Lymphadenopathy:    He has no cervical adenopathy.  Neurological: He is alert and oriented to person, place, and time.  Coordination normal.  Skin: Skin is warm. No rash noted. He is not diaphoretic. There is erythema.       Bilateral venous stasis  changes involving the family's. He has an erythema that is greater on the right than the left leg posteriorly in the calf. Some warmth about the catheter as well. He has some lesions apparently had blistered and are now hyperpigmented  Psychiatric: He has a normal mood and affect. His behavior is normal. Judgment and thought content normal.          Assessment & Plan:  Cellulitis This recurrence of apparent cellulitis seems to be most likely due to his underlying poor venous outflow likely impaired lymphatic flow. I would recommend him having arterial brachial indices checked to check for evidence of peripheral vascular disease see if there is anything that needs attention to a cardiologist or a vascular surgeon. I also might recommend him being seen by podiatrist and he gets regular foot care. I recommended him using Lamisil topically to his nails were has onychomycosis. For his particular recent infection I'm going to give him more reliable and 5 methicillin resistance staph aureus coverage with twice-daily Bactrim for 30 days. We'll check a baseline metabolic panel and CBC today. Will recheck a metabolic panel in 2 weeks to make sure he does not have too much the way of pseudohyperkalemia or false elevation in creatinine or any genuine toxic effects of trimethoprim sulfal  PVD (peripheral vascular disease) I suspect he may have this again I would not recommend getting ABI studies  Venous stasis dermatitis Much of his findings are consistent with chronic venous stasis changes.  Onychomycosis Have recommended he use topical Lamisil for now. One could consider systemic to Lamisil in the future.

## 2010-12-10 NOTE — Patient Instructions (Signed)
You have venous stasis dermatitis and less optimal blood flow, lymphatic flow I recommend the  [facility] obtaining a [facility] for ABI's to test your blood flow I recommend your having regular care with a podiatrist I recommend your using topical OTC lamisil on your toenails twice daily  We will give you Bactrim DS two tablets twice daiily for amonth Please make sure that you are well hydrated while taking this medication

## 2010-12-10 NOTE — Assessment & Plan Note (Signed)
Have recommended he use topical Lamisil for now. One could consider systemic to Lamisil in the future.

## 2010-12-10 NOTE — Assessment & Plan Note (Signed)
This recurrence of apparent cellulitis seems to be most likely due to his underlying poor venous outflow likely impaired lymphatic flow. I would recommend him having arterial brachial indices checked to check for evidence of peripheral vascular disease see if there is anything that needs attention to a cardiologist or a vascular surgeon. I also might recommend him being seen by podiatrist and he gets regular foot care. I recommended him using Lamisil topically to his nails were has onychomycosis. For his particular recent infection I'm going to give him more reliable and 5 methicillin resistance staph aureus coverage with twice-daily Bactrim for 30 days. We'll check a baseline metabolic panel and CBC today. Will recheck a metabolic panel in 2 weeks to make sure he does not have too much the way of pseudohyperkalemia or false elevation in creatinine or any genuine toxic effects of trimethoprim sulfal

## 2010-12-10 NOTE — Assessment & Plan Note (Signed)
Much of his findings are consistent with chronic venous stasis changes.

## 2010-12-10 NOTE — Assessment & Plan Note (Signed)
I suspect he may have this again I would not recommend getting ABI studies

## 2010-12-11 NOTE — Assessment & Plan Note (Signed)
[facility]   NAME:  Andre Hunter, Andre Hunter              ACCOUNT NO.:  [id number]   MEDICAL RECORD NO.:  [id number]      DATE OF BIRTH:  [birth year]   PHYSICIAN:  Leonie Man, M.D.    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   Andre Hunter has been treated with compression over the past several  weeks and is now feeling well.  His wounds are now completely closed.  He still has some mild residual inflammatory changes from his chronic  venous stasis disease and this is not told tender and not associated  with any cellulitis.   On today's examination, he is afebrile and his blood pressure is  165/106, temperature 97.5, pulse 64, and respirations 19.   RECOMMENDATIONS:  1. We will discharge him from weekly followup at this point and will      follow up with him p.r.n.  He is advised to use compression      stockings.  I have given her a prescription for compression      stockings at 20-30 mmHg pressure at the ankles, which he is asked      to wear during the daytime and to remove just before bedtime at      night.  2. He has also been advised to seek further followup with his primary      care physician due to his ongoing hypertension.      Leonie Man, M.D.  Electronically Signed     PB/MEDQ  D:  01/24/2009  T:  01/25/2009  Job:  [id number]

## 2010-12-11 NOTE — Consult Note (Signed)
NAMEDAMIEAN, LUKES NO.:  [id number]   MEDICAL RECORD NO.:  [id number]           PATIENT TYPE:   LOCATION:  REC                          FACILITY:  [facility]   PHYSICIAN:  Barry Dienes. Eloise Harman, M.D.DATE OF BIRTH:  [birth year]   DATE OF CONSULTATION:  DATE OF DISCHARGE:                                 CONSULTATION   CHIEF COMPLAINT:  Ulcer on the left leg that has been slow to heal.   HISTORY OF PRESENT ILLNESS:  The patient is a 65 year old white man who  in March 2010 had heavy cellulitis of the left leg for which he was  treated with 4 different antibiotics.  This cellulitis eventually  resolved with treatment with an IV antibiotic.  He stated that the  culture did not reveal MRSA.  Following treatment with antibiotics, he  had blisters develop on his left leg and his lips.  One of the blisters  continue to ooze despite treatment with Neosporin and gauze.  He has not  had significant pain in the area or drainage.  He does indicate some  left calf pain when walking fast.  His medical history is otherwise  significant for diabetes mellitus, type 2, with neuropathy and excellent  control of his blood sugars of late with a hemoglobin A1c level less  than 6.0%.   OTHER PAST MEDICAL HISTORY:  Hypertension, hyperlipidemia, asthma with  increased symptoms lately resulting in low-dose prednisone treatment,  glaucoma, morbid obesity for which he is on a diet (Optifast) and has  lost approximately 60 pounds in the past year, obstructive sleep apnea  for which he is on CPAP and gastroesophageal reflux disease.   CURRENT MEDICATIONS:  1. Gemfibrozil 600 mg p.o. b.i.d.  2. Glipizide 5 mg p.o. b.i.d.  3. Lisinopril 10 mg one-half tab p.o. daily.  4. Lasix 20 mg p.o. daily.  5. Amlodipine 10 mg p.o. daily.  6. Simvastatin 80 mg p.o. daily.  7. Timolol eye drops.  8. Singulair 10 mg p.o. daily.  9. He recently started prednisone 5 mg daily for a 10-day course.   ALLERGIES:   No known drug allergies.   PAST SURGICAL HISTORY:  Appendectomy.   FAMILY HISTORY:  Significant for his son who has type 1 diabetes and his  father who had a stroke and died at age 58.  There is no family history  of colon cancer or breast cancer.   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  He has 1 son.  He has a history of tobacco abuse  that was stopped in October 2009 and no history of alcohol abuse.   REVIEW OF SYSTEMS:  He has lost approximately 60 pounds in the past year  on the Optifast diet.  He has a chronic dry cough and recently has had  some mild dyspnea on exertion.  He denies chest pain or palpitations or  GI symptoms such as nausea, vomiting, diarrhea, or constipation.  He has  chronic bilateral leg  edema that improves overnight.  He has nocturia  x3.  He uses his CPAP equipment every night to improve the quality of  his sleeping.   INITIAL PHYSICAL EXAMINATION:  VITAL SIGNS:  Blood pressure 178/66,  pulse 71, respirations 18, temperature 98.5, most recent capillary blood  glucose level was 98.  GENERAL:  He is an overweight white man who is in no apparent distress  while sitting upright in a chair.  HEAD, EYES, EARS, NOSE, AND THROAT:  Within normal limits, neck was  supple and without jugular venous distention or carotid bruit.  CHEST:  Clear to auscultation.  HEART:  A regular rate and rhythm with a systolic ejection murmur of  grade 2/6 at the left sternal border.  ABDOMEN:  Normal bowel sounds and no hepatosplenomegaly or tenderness.  EXTREMITIES:  Bilateral 2+ pitting edema (right greater than left).  His  pedal pulses were normal.  He had intact light touch sensation in both  feet.  On the mid anterior left leg was an ulcer measuring 0.6 cm x 0.5  cm x 0.1 cm.  This ulcer was fully covered with eschar initially.  There  was no significant  necrotic debris.  After the ulcer was removed, the  wound base was pale pink in color with some pink granulation tissue.  There was no exposed bone, tendon, or muscle and no foul odor.  There is  no significant drainage, and the periwound integrity was intact.  Of  note, he has hyperpigmentation in both legs consistent with chronic  venous insufficiency.   LABORATORY AND X-RAY STUDIES:  1. December 30, 2008, serum sodium was 139 with potassium 4.4, chloride      101, carbon dioxide 28, glucose 141, BUN 30, creatinine 1.3.  2. December 30, 2008, x-rays of the left tibia and fibula showed some soft      tissue swelling over the anterior proximal left tibia with no bony      abnormalities or signs of osteomyelitis.  3. October 17, 2008, left lower extremity DVT ultrasound exam showed no      evidence of DVT, superficial venous thrombus, or venous      incontinence.   ASSESSMENT:  Residual venous stasis ulcer on the left leg.  His  bilateral lower extremity edema is likely multifactorial and due to  venous hypertension from obstructive sleep apnea.  Surprisingly, his  ultrasound study showed no evidence of venous incontinence as his  hyperpigmentation in the leg suggests chronic venous insufficiency.  He  may have some edema due to treatment with Norvasc.  His cough could be  partially exacerbated by ongoing treatment with lisinopril.   PLAN:  A #15-scalpel blade was used to gently remove the eschar from the  left leg wound.  This was accomplished without significant pain or  discomfort.  Following this, the ulcer was covered with Acticoat, then  gauze, which will be changed every other day.  He was instructed on how  to do so.  He was also advised that he discussed with his primary care  physician consideration to a trial of switching from lisinopril to  Cozaar to eliminate the possibility of ACE inhibitor-induced cough.  In  addition, should his blood pressure allow it, it was advised that he   discuss with his primary care physician the possibility of decreasing  his dose of Norvasc.  He will have a physician reevaluation in  approximately 2 weeks.  ______________________________  Barry Dienes Eloise Harman, M.D.     DGP/MEDQ  D:  01/10/2009  T:  01/11/2009  Job:  [id number]   cc:   Lelon Perla, DO

## 2010-12-14 ENCOUNTER — Telehealth: Payer: Self-pay | Admitting: Licensed Clinical Social Worker

## 2010-12-14 NOTE — Telephone Encounter (Signed)
Patient is having diarrhea after starting the oral antibiotic, he is using Align and it's not helping. Would like more suggestions on what he could use.

## 2010-12-14 NOTE — Telephone Encounter (Signed)
I would keep antibiotics the same. He can drop dose to one tab twice dialy if he likes. Lets see how he does. If this worsens we will need to check for c diff colitis

## 2010-12-21 ENCOUNTER — Encounter: Payer: Self-pay | Admitting: Infectious Disease

## 2010-12-25 ENCOUNTER — Other Ambulatory Visit (INDEPENDENT_AMBULATORY_CARE_PROVIDER_SITE_OTHER): Payer: Medicare Other

## 2010-12-25 DIAGNOSIS — L0291 Cutaneous abscess, unspecified: Secondary | ICD-10-CM

## 2010-12-25 DIAGNOSIS — L0390 Cellulitis, unspecified: Secondary | ICD-10-CM

## 2010-12-25 LAB — COMPREHENSIVE METABOLIC PANEL
AST: 28 U/L (ref 0–37)
Alkaline Phosphatase: 129 U/L — ABNORMAL HIGH (ref 39–117)
Chloride: 101 mEq/L (ref 96–112)
Creat: 2.3 mg/dL — ABNORMAL HIGH (ref 0.40–1.50)
Potassium: 5.8 mEq/L — ABNORMAL HIGH (ref 3.5–5.3)
Total Bilirubin: 0.4 mg/dL (ref 0.3–1.2)
Total Protein: 6.3 g/dL (ref 6.0–8.3)

## 2011-01-01 ENCOUNTER — Telehealth: Payer: Self-pay | Admitting: Licensed Clinical Social Worker

## 2011-01-01 NOTE — Telephone Encounter (Signed)
Patient stating that he had increased diarrhea on doxycycline for 2 pills daily, patient decreased to one pill daily and is feeling better. He wants to know if he could stop the antibiotic completely.

## 2011-01-07 NOTE — Telephone Encounter (Signed)
I thought he was on bactrim and not doxycyline. If he wishes he can come off the antibiotic but there seems to be confusion as to what he is actually taking. I thought I had written script for bactrim--which is what is in Epic as well

## 2011-01-08 NOTE — Telephone Encounter (Signed)
I know the patient was changed to doxycycline because he was having bad diarrhea with the bactrim, I didn't add it to his medication profile. I called him to see how he was and to make sure of what he was taking. I left a message because he did not answer.

## 2011-01-14 ENCOUNTER — Telehealth: Payer: Self-pay | Admitting: *Deleted

## 2011-01-14 ENCOUNTER — Ambulatory Visit (INDEPENDENT_AMBULATORY_CARE_PROVIDER_SITE_OTHER): Payer: Medicare Other | Admitting: Infectious Disease

## 2011-01-14 ENCOUNTER — Emergency Department (HOSPITAL_COMMUNITY): Payer: Medicare Other

## 2011-01-14 ENCOUNTER — Encounter: Payer: Self-pay | Admitting: Infectious Disease

## 2011-01-14 ENCOUNTER — Inpatient Hospital Stay (HOSPITAL_COMMUNITY)
Admission: EM | Admit: 2011-01-14 | Discharge: 2011-01-16 | DRG: 684 | Disposition: A | Discharge status: Home / Self Care | Payer: Medicare Other | Attending: Family Medicine | Admitting: Family Medicine

## 2011-01-14 DIAGNOSIS — N179 Acute kidney failure, unspecified: Principal | ICD-10-CM | POA: Diagnosis present

## 2011-01-14 DIAGNOSIS — I129 Hypertensive chronic kidney disease with stage 1 through stage 4 chronic kidney disease, or unspecified chronic kidney disease: Secondary | ICD-10-CM | POA: Diagnosis present

## 2011-01-14 DIAGNOSIS — Z113 Encounter for screening for infections with a predominantly sexual mode of transmission: Secondary | ICD-10-CM

## 2011-01-14 DIAGNOSIS — N189 Chronic kidney disease, unspecified: Secondary | ICD-10-CM | POA: Diagnosis present

## 2011-01-14 DIAGNOSIS — J45909 Unspecified asthma, uncomplicated: Secondary | ICD-10-CM | POA: Diagnosis present

## 2011-01-14 DIAGNOSIS — I4510 Unspecified right bundle-branch block: Secondary | ICD-10-CM | POA: Diagnosis present

## 2011-01-14 DIAGNOSIS — L03119 Cellulitis of unspecified part of limb: Secondary | ICD-10-CM

## 2011-01-14 DIAGNOSIS — R0989 Other specified symptoms and signs involving the circulatory and respiratory systems: Secondary | ICD-10-CM | POA: Diagnosis present

## 2011-01-14 DIAGNOSIS — R29898 Other symptoms and signs involving the musculoskeletal system: Secondary | ICD-10-CM

## 2011-01-14 DIAGNOSIS — L0390 Cellulitis, unspecified: Secondary | ICD-10-CM

## 2011-01-14 DIAGNOSIS — E119 Type 2 diabetes mellitus without complications: Secondary | ICD-10-CM | POA: Diagnosis present

## 2011-01-14 DIAGNOSIS — I872 Venous insufficiency (chronic) (peripheral): Secondary | ICD-10-CM | POA: Diagnosis present

## 2011-01-14 DIAGNOSIS — E875 Hyperkalemia: Secondary | ICD-10-CM

## 2011-01-14 DIAGNOSIS — I517 Cardiomegaly: Secondary | ICD-10-CM | POA: Diagnosis present

## 2011-01-14 DIAGNOSIS — R0609 Other forms of dyspnea: Secondary | ICD-10-CM | POA: Diagnosis present

## 2011-01-14 DIAGNOSIS — E785 Hyperlipidemia, unspecified: Secondary | ICD-10-CM | POA: Diagnosis present

## 2011-01-14 DIAGNOSIS — L02419 Cutaneous abscess of limb, unspecified: Secondary | ICD-10-CM

## 2011-01-14 DIAGNOSIS — G4733 Obstructive sleep apnea (adult) (pediatric): Secondary | ICD-10-CM | POA: Diagnosis present

## 2011-01-14 DIAGNOSIS — Z794 Long term (current) use of insulin: Secondary | ICD-10-CM

## 2011-01-14 DIAGNOSIS — R609 Edema, unspecified: Secondary | ICD-10-CM | POA: Diagnosis present

## 2011-01-14 LAB — CBC
HCT: 43.1 % (ref 39.0–52.0)
MCHC: 34.6 g/dL (ref 30.0–36.0)
Platelets: 175 10*3/uL (ref 150–400)
RDW: 15.1 % (ref 11.5–15.5)
WBC: 6.7 10*3/uL (ref 4.0–10.5)

## 2011-01-14 LAB — CBC WITH DIFFERENTIAL/PLATELET
Eosinophils Relative: 5 % (ref 0–5)
HCT: 44.8 % (ref 39.0–52.0)
Lymphocytes Relative: 13 % (ref 12–46)
Lymphs Abs: 0.7 10*3/uL (ref 0.7–4.0)
MCH: 30 pg (ref 26.0–34.0)
Monocytes Absolute: 0.7 10*3/uL (ref 0.1–1.0)
Neutrophils Relative %: 68 % (ref 43–77)

## 2011-01-14 LAB — COMPLETE METABOLIC PANEL WITH GFR
AST: 27 U/L (ref 0–37)
Calcium: 9.8 mg/dL (ref 8.4–10.5)
Chloride: 104 mEq/L (ref 96–112)
Creat: 2.09 mg/dL — ABNORMAL HIGH (ref 0.50–1.35)
GFR, Est Non African American: 32 mL/min — ABNORMAL LOW (ref >60)
Glucose, Bld: 99 mg/dL (ref 70–99)
Potassium: 7.2 mEq/L (ref 3.5–5.3)
Sodium: 136 mEq/L (ref 135–145)
Total Bilirubin: 0.3 mg/dL (ref 0.3–1.2)

## 2011-01-14 LAB — COMPREHENSIVE METABOLIC PANEL
Alkaline Phosphatase: 145 U/L — ABNORMAL HIGH (ref 39–117)
BUN: 34 mg/dL — ABNORMAL HIGH (ref 6–23)
Creatinine, Ser: 2.17 mg/dL — ABNORMAL HIGH (ref 0.50–1.35)
GFR calc Af Amer: 37 mL/min — ABNORMAL LOW (ref >60)
Glucose, Bld: 112 mg/dL — ABNORMAL HIGH (ref 70–99)
Potassium: 7.4 mEq/L (ref 3.5–5.1)
Total Protein: 6.7 g/dL (ref 6.0–8.3)

## 2011-01-14 LAB — DIFFERENTIAL
Basophils Absolute: 0 10*3/uL (ref 0.0–0.1)
Basophils Relative: 0 % (ref 0–1)
Eosinophils Absolute: 0.2 10*3/uL (ref 0.0–0.7)
Eosinophils Relative: 3 % (ref 0–5)
Monocytes Absolute: 0.6 10*3/uL (ref 0.1–1.0)

## 2011-01-14 LAB — D-DIMER, QUANTITATIVE: D-Dimer, Quant: 0.82 ug/mL-FEU — ABNORMAL HIGH (ref 0.00–0.48)

## 2011-01-14 LAB — PRO B NATRIURETIC PEPTIDE: Pro B Natriuretic peptide (BNP): 32 pg/mL (ref 0–125)

## 2011-01-14 LAB — BASIC METABOLIC PANEL
BUN: 33 mg/dL — ABNORMAL HIGH (ref 6–23)
Creatinine, Ser: 2.17 mg/dL — ABNORMAL HIGH (ref 0.50–1.35)
GFR calc Af Amer: 37 mL/min — ABNORMAL LOW (ref >60)
GFR calc non Af Amer: 31 mL/min — ABNORMAL LOW (ref >60)
Potassium: 6.9 mEq/L (ref 3.5–5.1)

## 2011-01-14 LAB — CK TOTAL AND CKMB (NOT AT ARMC)
CK, MB: 7.1 ng/mL (ref 0.3–4.0)
Relative Index: 2.3 (ref 0.0–2.5)
Total CK: 311 U/L — ABNORMAL HIGH (ref 7–232)

## 2011-01-14 LAB — CARDIAC PANEL(CRET KIN+CKTOT+MB+TROPI): Total CK: 295 U/L — ABNORMAL HIGH (ref 7–232)

## 2011-01-14 MED ORDER — TECHNETIUM TO 99M ALBUMIN AGGREGATED
6.0000 | Freq: Once | INTRAVENOUS | Status: AC | PRN
  Administered 2011-01-14: 6 via INTRAVENOUS

## 2011-01-14 MED ORDER — XENON XE 133 GAS
10.0000 | GAS_FOR_INHALATION | Freq: Once | RESPIRATORY_TRACT | Status: AC | PRN
Start: 2011-01-14 — End: 2011-01-14
  Administered 2011-01-14: 10 via RESPIRATORY_TRACT

## 2011-01-14 NOTE — Assessment & Plan Note (Signed)
We will observe him off antibiotics for now continue local wound care and therapy to help ameliorate his venous stasis we are checking Dopplers for an arterial brachial indices at the [facility].

## 2011-01-14 NOTE — Assessment & Plan Note (Signed)
Not clearly what is causing this I am checking a CPK as well as the above-mentioned labs

## 2011-01-14 NOTE — Assessment & Plan Note (Signed)
We are bringing him back in our department for repeat testing of his labs EKG and treatment of hyperkalemia this is true hyperkalemia. Again is potassium on stat testing was 7.2.

## 2011-01-14 NOTE — Patient Instructions (Signed)
We will check labs today. We may call you back if there are any urgent labs Rtc in 2 months

## 2011-01-14 NOTE — Progress Notes (Signed)
Subjective:    Patient ID: Andre Hunter, male    DOB: [birth year], 65 y.o.   MRN: [id number]  HPI  65 yo with diabetes mellitus, venous stasis changes, PVD with problems with recurrent "cellulitis." First episode was a year ago left leg and it improved with a "strong iv antibiotic." He then was doing well until he went to [place]. Was in [place] in [facility] With right leg. He was given clindamycin and ciprofloxacin. He then developedl cellultis approximately 2 months ago. He was placed on cipro and clinda again with minimal improvement then put on cephalexin with unimpressive response. He has been on antibiotics for a proximate 2 months in total. Lungs are then tested for MRSA although don't know where he was this was sampled this was some superficial swab for nasal PCR test. His cellulitis is characterized by pain and erythema and also blistering. Blisters at times draining clear colored material. The patient denies having gone into any bodies of water such as erosions or legs prior to the onset of any of his episodes of cellulitis. He does not have any known liver disease as does not drink alcohol . At his last visit I placed him on Bactrim and he was treated with this for the past month. Since then he developed increasing problems breathing. He does have chronic dyspnea on exertion which he thinks is related to his weight. However this is worse and has had difficulty in walking across the room. States has been evaluated with primary care physician and given prednisone with some relief. He also endorses a feeling of heaviness in his legs which he felt worse and with the administration of prednisone. Due to his complaints we ordered some stat labs including a CBC metabolic panel liver function tests and a CPK as well as an beta natruretic peptide. An always been bigger than 45 minutes the patient including counseling cord and care. We ultimately had to bring the patient back to the emergency department due to a  serum potassium measured at 7.2     Review of Systems  Constitutional: Positive for activity change and fatigue. Negative for fever, chills, diaphoresis, appetite change and unexpected weight change.  HENT: Negative for facial swelling and neck pain.   Eyes: Negative for photophobia and visual disturbance.  Respiratory: Positive for choking and shortness of breath. Negative for cough, chest tightness, wheezing and stridor.   Cardiovascular: Positive for leg swelling. Negative for chest pain and palpitations.  Gastrointestinal: Negative for nausea, vomiting, diarrhea and abdominal distention.  Genitourinary: Negative for dysuria and flank pain.  Musculoskeletal: Positive for myalgias and arthralgias. Negative for back pain and gait problem.  Skin: Negative for pallor and rash.  Neurological: Positive for weakness. Negative for dizziness and tremors.  Hematological: Negative for adenopathy. Does not bruise/bleed easily.  Psychiatric/Behavioral: Negative for behavioral problems and agitation.       Objective:   Physical Exam  Constitutional: He appears well-developed. No distress.  HENT:  Head: Normocephalic and atraumatic.  Mouth/Throat: No oropharyngeal exudate.  Eyes: EOM are normal. Pupils are equal, round, and reactive to light. No scleral icterus.  Neck: Normal range of motion. Neck supple. No tracheal deviation present.  Cardiovascular: Normal rate and regular rhythm.  Exam reveals no gallop and no friction rub.   No murmur heard. Pulmonary/Chest: No stridor. He has no wheezes. He has no rales.       Did appear comfortable walking about out of the room. With increase in rate of breathing.  Musculoskeletal:  He exhibits edema.  Lymphadenopathy:    He has no cervical adenopathy.  Neurological: He is alert.  Skin: No rash noted. He is not diaphoretic.       He has chronic venous stasis changes bilaterally. There is no weeping today there is you feel bilaterally but not much the  way of warmth or drainage.  Psychiatric: He has a normal mood and affect. His behavior is normal. Judgment and thought content normal.          Assessment & Plan:  Hyperkalemia We are bringing him back in our department for repeat testing of his labs EKG and treatment of hyperkalemia this is true hyperkalemia. Again is potassium on stat testing was 7.2.  Leg heaviness Not clearly what is causing this I am checking a CPK as well as the above-mentioned labs  Dyspnea on exertion This is worse in the last several months and start antibiotic. I wonder if he might have worsening renal function and this may be contributing to fluid overload. Has been taking extra doses of Lasix and sent. We will check a stat CMP CBC beta natruretic peptide. Once his potassium came back at 7.2  was sent to the emergency department.  CELLULITIS, LEG, LEFT We will observe him off antibiotics for now continue local wound care and therapy to help ameliorate his venous stasis we are checking Dopplers for an arterial brachial indices at the [facility].

## 2011-01-14 NOTE — Assessment & Plan Note (Signed)
This is worse in the last several months and start antibiotic. I wonder if he might have worsening renal function and this may be contributing to fluid overload. Has been taking extra doses of Lasix and sent. We will check a stat CMP CBC beta natruretic peptide. Once his potassium came back at 7.2  was sent to the emergency department.

## 2011-01-15 DIAGNOSIS — M7989 Other specified soft tissue disorders: Secondary | ICD-10-CM

## 2011-01-15 LAB — TSH: TSH: 7.599 u[IU]/mL — ABNORMAL HIGH (ref 0.350–4.500)

## 2011-01-15 LAB — GLUCOSE, CAPILLARY
Glucose-Capillary: 140 mg/dL — ABNORMAL HIGH (ref 70–99)
Glucose-Capillary: 147 mg/dL — ABNORMAL HIGH (ref 70–99)
Glucose-Capillary: 153 mg/dL — ABNORMAL HIGH (ref 70–99)

## 2011-01-15 LAB — BASIC METABOLIC PANEL
BUN: 32 mg/dL — ABNORMAL HIGH (ref 6–23)
Calcium: 9.1 mg/dL (ref 8.4–10.5)
Creatinine, Ser: 2.22 mg/dL — ABNORMAL HIGH (ref 0.50–1.35)
GFR calc non Af Amer: 30 mL/min — ABNORMAL LOW (ref >60)
Glucose, Bld: 162 mg/dL — ABNORMAL HIGH (ref 70–99)

## 2011-01-15 LAB — HEMOGLOBIN A1C: Hgb A1c MFr Bld: 6.6 % — ABNORMAL HIGH (ref <5.7)

## 2011-01-15 LAB — LIPID PANEL: LDL Cholesterol: 52 mg/dL (ref 0–99)

## 2011-01-15 LAB — CARDIAC PANEL(CRET KIN+CKTOT+MB+TROPI)
CK, MB: 5.7 ng/mL — ABNORMAL HIGH (ref 0.3–4.0)
Total CK: 338 U/L — ABNORMAL HIGH (ref 7–232)

## 2011-01-15 NOTE — Telephone Encounter (Signed)
Dr. Daiva Eves called.  Already knew of this result. Jennet Maduro, RN.

## 2011-01-16 LAB — BASIC METABOLIC PANEL
CO2: 25 mEq/L (ref 19–32)
Chloride: 106 mEq/L (ref 96–112)
Glucose, Bld: 108 mg/dL — ABNORMAL HIGH (ref 70–99)
Potassium: 4.9 mEq/L (ref 3.5–5.1)
Sodium: 139 mEq/L (ref 135–145)

## 2011-01-16 LAB — GLUCOSE, CAPILLARY

## 2011-01-20 NOTE — H&P (Signed)
Andre Hunter, DISE NO.:  [id number]  MEDICAL RECORD NO.:  [id number]  LOCATION:  [facility]                         FACILITY:  [facility]  PHYSICIAN:  Andre Scott, MD     DATE OF BIRTH:  January 04, 1946  DATE OF ADMISSION:  01/14/2011 DATE OF DISCHARGE:                             HISTORY & PHYSICAL   PRIMARY CARE PHYSICIAN:  At the [place].  ENDOCRINOLOGIST:  Andre Frames, MD  INFECTIOUS DISEASE PHYSICIAN:  Andre Lav, MD  NURSE PRACTITIONER/PRIMARY CARE PHYSICIAN:  Andre Hamper, NP  CHIEF COMPLAINT:  Leg heaviness and dyspnea on exertion plus abnormal labs.  HISTORY OF PRESENT ILLNESS:  Andre Hunter is a pleasant 65 year old insulin-dependent diabetic type 2 with hypertension, obstructive sleep apnea on nightly CPAP, and morbidly obese male patient who went to his Infectious Disease MD for followup of his lower extremity cellulitis. There he mentioned about his bilateral lower extremity heaviness and dyspnea on exertion.  The MD drew his labs and found his potassium to be 7.2.  The patient was sent to the emergency department for further evaluation and management.  The patient indicates that he has had bilateral lower extremity heaviness and dyspnea on exertion, ongoing for 3-4 months.  These were gradual in onset and progressively worsening.  Occasionally, he may have pain in the legs.  He has no history of recent travel.  He recently completed a course of doxycycline for his cellulitis and from a cellulitis standpoint he indicates that the legs were much better.  His legs are, however, swollen, the left one greater than the right.  He denies orthopnea or chest pain or cough or fever or chills.  He does indicate that his dyspnea on exertion is progressively worsening where walking from one end to the next end of the room would make him dyspneic.  He is compliant with his nightly CPAP.  He has no paroxysmal nocturnal dyspnea.  PAST MEDICAL HISTORY: 1.  Type 2 diabetes mellitus/insulin resistant, on insulins. 2. Hypertension. 3. Hyperlipidemia. 4. Asthma. 5. Obstructive sleep apnea, on CPAP. 6. Heart murmur. 7. Stress test, 2 years ago, said to be negative.  PAST SURGICAL HISTORY:  Appendectomy.  ALLERGIES:  No known drug allergies.  MEDICATIONS: 1. Insulins, though insulins are not clear but according to his     endocrinologist the patient takes high doses of NPH insulin and     Humalog lispro which he gets filled through his [place]. 2. Simvastatin 40 mg p.o. at bedtime. 3. Lisinopril 5 mg p.o. daily. 4. Amlodipine 10 mg p.o. daily. 5. Gemfibrozil 600 mg p.o. b.i.d. 6. Fish oil 1 capsule p.o. 3 times a day. 7. Lasix 20 mg p.o. daily. 8. Hydroxyzine 10 mg p.o. b.i.d.  FAMILY HISTORY:  The patient's son has type 1 diabetes.  SOCIAL HISTORY:  The patient is married.  He is self-employed as a Transport planner.  He is independent of activities of daily living.  There is remote history of smoking, occasional alcohol use, and no drug abuse.  ADVANCED DIRECTIVES:  The patient does not have a healthcare power of attorney and he is a full code.  REVIEW OF SYSTEMS:  All systems reviewed and apart from  history of presenting illness are negative.  PHYSICAL EXAMINATION:  GENERAL:  Andre Hunter is a moderately built and morbidly obese male patient who is in no obvious distress. VITAL SIGNS:  Temperature is 97.4 degrees Fahrenheit, blood pressure 134/59 mmHg, pulse 63 per minute, respirations 18 per minute, and saturating at 97% on oxygen. RESPIRATORY SYSTEM:  With occasional basal crackles but otherwise clear to auscultation and no increased work of breathing. CARDIOVASCULAR SYSTEM:  First and second heart sounds heard and regular. No JVD or murmurs. ABDOMEN:  Obese, nontender, soft, and bowel sounds normally heard.  No organomegaly or mass appreciated. CENTRAL NERVOUS SYSTEM:  The patient is awake, alert, oriented x3 with no focal  neurological deficits. EXTREMITIES:  With grade 5/5 power.  The patient has 2-3+ bilateral lower extremity pitting edema.  The patient has mild redness of his middle part of the legs but he does indicate it is much better than before.  There are no open wounds. MUSCULOSKELETAL SYSTEM:  Negative.  LABORATORY DATA:  Pro-BNP is 32.  D-dimer 0.82.  Cardiac enzymes show CK of 311, CK-MB 7.1, relative index 2.3, and comprehensive metabolic panel significant for sodium 134, potassium 7.4, chloride 104, bicarbonate 23, glucose of 112, BUN 34, and creatinine 2.17.  Alkaline phosphatase 145, AST 28, ALT 74, total protein 6.7, and albumin of 3.8.  Troponin is negative.  CBC is within normal limits.  Chest x-ray, impression:  Mild cardiomegaly and vascular congestion.  EKG shows normal sinus rhythm with a ventricular rate of 73 per minute, normal axis, and right bundle-branch block.  There are no prior EKGs to compare.  ASSESSMENT AND PLAN: 1. Acute on chronic kidney disease.  Differential diagnosis is     dehydration, ACE inhibitors in the context of diuretics.  These     culprit medications will be held.  The patient will be hydrated     gently with IV fluids and will follow his basic metabolic panel     closely.  His baseline creatinine according to his endocrinologist     is probably in the 1.8-1.9 range. 2. Hyperkalemia secondary to acute renal failure and ACE inhibitors.     These medications will be held.  The patient is status post calcium     gluconate, insulin dextrose, and Kayexalate.  We will repeat his     basic metabolic panel and treat appropriately. 3. Dyspnea, unclear etiology.  Differential diagnosis includes     secondary to obstructive sleep apnea/obesity hypoventilation     syndrome, rule out pulmonary embolism by obtaining a V/Q scan and     bilateral lower extremity venous Dopplers.  Also, to rule out the     right heart failure and pulmonary hypertension.  We will  obtain 2-D     echocardiogram.  For now, we will place on oxygen and p.r.n.     bronchodilator nebulizations.  Monitor closely.  Depending on the     echocardiogram may warrant cardiology consultation. 4. Bilateral leg edema.  Rule out right heart failure. 5. Type 2 diabetes mellitus.  According to his endocrinologist, the     patient is insulin resistant.  We will place him on a reduced dose     of Lantus and NovoLog sliding scale insulin and meal coverage given     his worsening renal function and risk for hypoglycemia.  We will     monitor closely.  We will check his hemoglobin A1c. 6. Hypertension.  Continue his amlodipine but this may also  be the     cause of his leg edema and may have to be changed to something     else. 7. Full code status.  TIME SPENT:  Time taken in coordinating this history and physical note is 1 hour.     Andre Scott, MD     AH/MEDQ  D:  01/14/2011  T:  01/14/2011  Job:  [id number]  cc:   Andre Hunter, M.D. Andre Lav, MD  Electronically Signed by Andre Scott MD on 01/20/2011 01:49:39 AM

## 2011-02-13 NOTE — Discharge Summary (Signed)
Andre Hunter, Andre Hunter NO.:  [id number]  MEDICAL RECORD NO.:  [id number]  LOCATION:  6713                         FACILITY:  [facility]  PHYSICIAN:  Mauro Kaufmann, MD         DATE OF BIRTH:  15-Feb-1946  DATE OF ADMISSION:  01/14/2011 DATE OF DISCHARGE:  01/16/2011                              DISCHARGE SUMMARY   ADMISSION DIAGNOSES: 1. Acute on chronic kidney disease. 2. Hyperkalemia. 3. Dyspnea. 4. Bilateral leg edema. 5. Diabetes mellitus. 6. Hypertension.  DISCHARGE DIAGNOSES:  1  Acute on chronic renal disease improved after the diuresis, Lasix. 1. Hyperkalemia resolved. 2. Dyspnea resolved. 3. Chronic lower extremity edema. 4. Diabetes mellitus. 5. Hypertension.  Tests performed during the hospital stay include chest x-ray on July 18 showed mild cardiomegaly and vascular congestion, bibasilar atelectasis with low lung volumes.  VQ scan done on July 18 showed low probability for acute pulmonary embolus, chronic asymmetric elevation of right hemidiaphragm.  PERTINENT LABORATORY FINDINGS:  On the day of admission potassium was 7.4, at this time it is down to 4.9.  The patient's creatinine was 2.17 which were dropped towards to 2.22 as of June 20th, the patient's creatinine has come down to 1.90.  BUN is down from 34 to 25.  The patient's BNP was normal throughout the hospital stay.  Hemoglobin A1c was 6.6.  Cardiac enzymes x3 were negative.  BRIEF HISTORY AND PHYSICAL:  This is a 65 year old male with a history of diabetes mellitus, hypertension, obstructive sleep apnea, on night CPAP and morbid obesity who was supposed to follow up with ID for lower extremity cellulitis.  There he mentioned that he had bilateral leg heaviness and dyspnea on exertion.  The patient's labs were drawn and found that he had a potassium of 7.2.  The patient was sent to the hospital for further evaluation.  The patient was admitted with diagnosis of acute on chronic kidney  disease.  BRIEF HOSPITAL COURSE: 1. Acute on chronic renal disease.  The patient's creatinine is     probably in the range of 1.8 to 1.9 as per the endocrinologist.     Although at this time after the diuresis, the patient's creatinine     has come down to 2.22.  The chest x-ray showed bilateral vascular     congestion and his creatinine was getting worse after IV fluids, so     he was given a trial of Lasix and the patient's creatinine dropped     from 2.22 to 1.90.  Suggesting that there may be some element of     diastolic dysfunction contributing to the patient's dyspnea. 2. Dyspnea.  As above, the patient's dyspnea is multifactorial.     Because of history of asthma, obstructive sleep apnea with stable     ventilation syndrome.  He has been on CPAP at home.  Also the chest     x-ray showed bilateral vascular congestion though the BNP was     normal but the patient did improve after getting the IV Lasix of 40     mg and he diuresed about 2 L.  At this time he is off oxygen,  breathing well and his creatinine is back to his baseline.     Potassium is also looked normal at 4.9,  so the patient will be     continued on Lasix.  I will ask him to take 40 mg Lasix for the     next 3 days daily and then switch back to 20 mg p.o. daily. 3. Hyperkalemia.  The cause is unknown at this time except that the     patient was on ACE inhibitor and also with worsening of renal     function, the patient developed hyperkalemia.  At this time     patient's creatinine is back to normal, but I am going to hold the     ACE inhibitor at this time because of the patient's propensity for     getting hyperkalemia.  So the patient's basic metabolic panel needs     to be checked in 1 week and he will be off the ACE inhibitor at     this time. 4. Bilateral leg edema.  The patient has chronic venous insufficiency,     so he is going to have some edema, so he should be continued on     Lasix 20 mg p.o.  daily. 5. Diabetic.  The patient will also take his Lantus as well as the     sliding scale as per the endocrinologist's recommendation. 6. Hypertension.  The patient has been on amlodipine and lisinopril at     home.  It is hard to give lisinopril at this time and also the     patient has heart rate around 60s, so beta-blocker is also out of     question.  Hydrochlorothiazide can make renal functions worse and     also he is already on one of the diuretics that is Lasix.  The     patient will follow up with his primary care physician in 1 week     and he will check his blood pressure.  If blood pressure is still     elevated, he can be started on either hydralazine or clonidine as     per the primary care's physician's discretion.  At this time, the     patient's blood pressure is stable.  His blood pressure is 149/75,     respirations 18, pulse 65, temperature is 97.4, O2 sat 95% on room     air.  The patient is advised to check his blood pressure every day     and report to his primary care physician.  MEDICATION ON DISCHARGE: 1. Amlodipine 10 mg p.o. daily. 2. Fish oil 1 capsule p.o. 3 times daily. 3. Gemfibrozil 600 mg 1 tablet p.o. twice a day. 4. Humalog lispro 15-30 units subcu 3 times a day. 5. Hydroxyzine 10 mg p.o. twice a day. 6. Lasix 20 mg 2 tablets daily for the next 3 days and take 1 tablet     daily. 7. NovoLog aspart 35 units in the morning and 85 units at night. 8. Simvastatin 40 mg p.o. daily. 9. Stop taking lisinopril.  The patient had an echocardiogram done in the hospital which showed EF of 35-60%, wall motion was normal.  The patient had a mild LVH pattern. There was left ventricular diastolic function that was normal.  Right ventricular cavity size was normal, systolic function was normal. Pulmonary artery systolic pressure was mildly increased.     Mauro Kaufmann, MD     GL/MEDQ  D:  01/16/2011  T:  01/16/2011  Job:  [id number]  cc:   Acey Lav,  MD Dorisann Frames, M.D.  Electronically Signed by Mauro Kaufmann  on 02/13/2011 07:44:28 PM

## 2011-12-31 ENCOUNTER — Ambulatory Visit (INDEPENDENT_AMBULATORY_CARE_PROVIDER_SITE_OTHER): Payer: Medicare Other | Admitting: Internal Medicine

## 2011-12-31 ENCOUNTER — Encounter: Payer: Self-pay | Admitting: Internal Medicine

## 2011-12-31 VITALS — BP 156/80 | HR 84 | Temp 97.3°F | Ht 64.0 in | Wt 301.2 lb

## 2011-12-31 DIAGNOSIS — I8312 Varicose veins of left lower extremity with inflammation: Secondary | ICD-10-CM | POA: Insufficient documentation

## 2011-12-31 NOTE — Progress Notes (Signed)
Patient ID: Andre Hunter, male   DOB: 04-03-1946, 66 y.o.   MRN: [id number]     [facility] for Infectious Disease  Patient Active Problem List  Diagnoses  . NEVI, MULTIPLE  . DIABETES MELLITUS, TYPE II  . HYPERLIPIDEMIA  . OBESITY  . UNSPECIFIED ANEMIA  . HYPERTENSION  . ASTHMA  . GERD  . Recurrent cellulitis of lower leg  . EDEMA  . PVD (peripheral vascular disease)  . Venous stasis dermatitis  . Onychomycosis  . Dyspnea on exertion    Patient's Medications  New Prescriptions   No medications on file  Previous Medications   ALBUTEROL SULFATE IN    Inhale 2 puffs into the lungs as needed.   AMLODIPINE (NORVASC) 10 MG TABLET    Take 10 mg by mouth daily.     ASPIRIN 81 MG TABLET    Take 81 mg by mouth daily.   ATORVASTATIN (LIPITOR) 20 MG TABLET    Take 20 mg by mouth daily.   FISH OIL-OMEGA-3 FATTY ACIDS 1000 MG CAPSULE    Take 2 g by mouth daily.     FORMOTEROL (FORADIL) 12 MCG CAPSULE FOR INHALER    Place 12 mcg into inhaler and inhale 2 (two) times daily.   FUROSEMIDE (LASIX) 20 MG TABLET    Take 40 mg by mouth daily.    GABAPENTIN (NEURONTIN) 300 MG CAPSULE    Take 900 mg by mouth daily.   HYDROCODONE-ACETAMINOPHEN (NORCO) 5-325 MG PER TABLET       HYDROXYZINE (ATARAX/VISTARIL) 10 MG TABLET    Take 10 mg by mouth 3 (three) times daily.   INSULIN LISPRO (HUMALOG) 100 UNIT/ML INJECTION    Inject into the skin 3 (three) times daily before meals.     LIRAGLUTIDE (VICTOZA) 18 MG/3ML SOLN    Inject into the skin.   LISINOPRIL (PRINIVIL,ZESTRIL) 10 MG TABLET    Take 10 mg by mouth daily.     MOMETASONE (ASMANEX) 220 MCG/INH INHALER    Inhale 2 puffs into the lungs 2 (two) times daily.   SIMVASTATIN (ZOCOR) 80 MG TABLET    Take 80 mg by mouth at bedtime.     TIMOLOL (BETIMOL) 0.25 % OPHTHALMIC SOLUTION    1-2 drops 2 (two) times daily.    Modified Medications   No medications on file  Discontinued Medications   LYRICA 100 MG CAPSULE        Subjective: Andre Hunter is  seen on a work in basis. He was seen by my partner one year ago for recurrent left leg cellulitis in the setting of venous stasis. He finally healed after that episode and did not have any further problem until several days ago when he noticed increased swelling of his left lower leg and a few blisters developing. He recently went on a long car ride to [place] for a wedding and noticed a swelling beginning after that trip. He has not had any increased pain, redness, or fever.  He states that his normal weight is around 288 to 292 pounds. He is unaware of any weight gain recently. He has not changed any of his medications or missed doses recently.  Objective: Temp: 97.3 F (36.3 C) (06/04 1530) Temp src: Oral (06/04 1530) BP: 156/80 mmHg (06/04 1530) Pulse Rate: 84  (06/04 1530)  General: He is morbidly obese with a weight of 302 pounds. He is in good spirits Skin: He has chronic venous stasis dermatitis of both lower extremities. He has a  silver dollar sized blister with clear fluid over his left shin. He has one smaller blister about 5 mm in diameter on the left shin laterally. There is no increased warmth or redness of his left leg compared to the right. He has some thickened toenails but no evidence of onychomycosis or tinea pedis infection Lungs: Clear Cor: Regular S1 and S2 no murmurs Abdomen: Obese soft and nontender   Assessment: I do not think that he has recurrent cellulitis at this time. It appears that he has had some recent weight gain with fluid retention exacerbating his chronic venous stasis. He does not use compressive stockings and could not tolerate them at this time. I've suggested that he weigh himself daily and use a four-inch Ace wrap for gentle toe to knee compression of the left leg. I instructed him to put the Ace wrap on when he first wakes up in the morning. I expect that the blisters will eventually spontaneously rupture. He used to be followed at the local wound care  center and has some zinc oxide. If the blister opens up I've instructed him to cut it with zinc oxide and cover it with a nonstick bandage before using the Ace wrap. He is to call me if he develops any signs or symptoms of cellulitis.  Plan: 1. Daily weights 2. Ace wrap compression of the left lower leg 3. Followup in one week   [name], MD [facility] for Infectious Disease [facility] [phone] pager   [phone] cell 12/31/2011, 4:50 PM

## 2012-01-07 ENCOUNTER — Encounter: Payer: Self-pay | Admitting: Internal Medicine

## 2012-01-07 ENCOUNTER — Ambulatory Visit (INDEPENDENT_AMBULATORY_CARE_PROVIDER_SITE_OTHER): Payer: Medicare Other | Admitting: Internal Medicine

## 2012-01-07 VITALS — BP 149/68 | HR 75 | Temp 97.8°F | Wt 302.0 lb

## 2012-01-07 DIAGNOSIS — L03119 Cellulitis of unspecified part of limb: Secondary | ICD-10-CM

## 2012-01-07 MED ORDER — DOXYCYCLINE HYCLATE 100 MG PO TABS: 100.0000 mg | ORAL_TABLET | Freq: Two times a day (BID) | ORAL | Status: DC

## 2012-01-07 NOTE — Progress Notes (Signed)
Patient ID: Andre Hunter, male   DOB: 01/26/1946, 66 y.o.   MRN: [id number]     [facility] for Infectious Disease  Patient Active Problem List  Diagnoses  . NEVI, MULTIPLE  . DIABETES MELLITUS, TYPE II  . HYPERLIPIDEMIA  . OBESITY  . UNSPECIFIED ANEMIA  . HYPERTENSION  . ASTHMA  . GERD  . Recurrent cellulitis of lower leg  . EDEMA  . PVD (peripheral vascular disease)  . Venous stasis dermatitis  . Onychomycosis  . Dyspnea on exertion  . Venous stasis dermatitis, left    Patient's Medications  New Prescriptions   DOXYCYCLINE (VIBRA-TABS) 100 MG TABLET    Take 1 tablet (100 mg total) by mouth 2 (two) times daily.  Previous Medications   ALBUTEROL SULFATE IN    Inhale 2 puffs into the lungs as needed.   AMLODIPINE (NORVASC) 10 MG TABLET    Take 10 mg by mouth daily.     ASPIRIN 81 MG TABLET    Take 81 mg by mouth daily.   ATORVASTATIN (LIPITOR) 20 MG TABLET    Take 20 mg by mouth daily.   FISH OIL-OMEGA-3 FATTY ACIDS 1000 MG CAPSULE    Take 2 g by mouth daily.     FORMOTEROL (FORADIL) 12 MCG CAPSULE FOR INHALER    Place 12 mcg into inhaler and inhale 2 (two) times daily.   FUROSEMIDE (LASIX) 20 MG TABLET    Take 40 mg by mouth daily.    GABAPENTIN (NEURONTIN) 300 MG CAPSULE    Take 900 mg by mouth daily.   HYDROCODONE-ACETAMINOPHEN (NORCO) 5-325 MG PER TABLET       HYDROXYZINE (ATARAX/VISTARIL) 10 MG TABLET    Take 10 mg by mouth 3 (three) times daily.   INSULIN LISPRO (HUMALOG) 100 UNIT/ML INJECTION    Inject into the skin 3 (three) times daily before meals.     LIRAGLUTIDE (VICTOZA) 18 MG/3ML SOLN    Inject into the skin.   LISINOPRIL (PRINIVIL,ZESTRIL) 10 MG TABLET    Take 10 mg by mouth daily.     MOMETASONE (ASMANEX) 220 MCG/INH INHALER    Inhale 2 puffs into the lungs 2 (two) times daily.   SIMVASTATIN (ZOCOR) 80 MG TABLET    Take 80 mg by mouth at bedtime.     TIMOLOL (BETIMOL) 0.25 % OPHTHALMIC SOLUTION    1-2 drops 2 (two) times daily.    Modified Medications   No medications on file  Discontinued Medications   No medications on file    Subjective: [name] is in for his followup visit. 2 days after his visit last week the blisters on his left leg open to drain spontaneously. He has been putting Neosporin on the larger one anteriorly and covering it with a Band-Aid. He has noted a little bit of weeping yellow fluid. He believes the redness is slightly worse. He has only minimal pain. He has not had any fever. He states that his weight has been at his normal baseline.  Objective: Temp: 97.8 F (36.6 C) (06/11 0942) Temp src: Oral (06/11 0942) BP: 149/68 mmHg (06/11 0942) Pulse Rate: 75  (06/11 0942)  General: He is in no distress and in good spirits Skin: There is a large, silver dollar sized superficial ulcer over his anterior left shin and a dime-sized ulcer posteriorly. There is no drainage. He does have redness circumferentially around his mid left shin.   Assessment: He has 2 venous stasis ulcers on his left leg and may  have early cellulitis. I will treat him with oral doxycycline. I will also try to obtain the results of lab work done at his primary care physician's office about one month ago. He states that he has had some renal insufficiency and that Dr. Daiva Eves felt that it might have been caused by trimethoprim sulfamethoxazole last year.  Plan: 1. Doxycycline 100 mg twice daily for 7 days 2. Followup in 2 weeks   [name], MD [facility] [phone] pager   [phone] cell 01/07/2012, 10:16 AM

## 2012-01-13 ENCOUNTER — Other Ambulatory Visit: Payer: Self-pay | Admitting: Internal Medicine

## 2012-01-14 ENCOUNTER — Telehealth: Payer: Self-pay | Admitting: Licensed Clinical Social Worker

## 2012-01-14 NOTE — Telephone Encounter (Signed)
Refill request came in for doxycycline. I noticed in the note for the last ov that the patient received 14 pills. I wanted to know if it can be refilled.

## 2012-01-14 NOTE — Telephone Encounter (Signed)
He may have one refill 

## 2012-01-20 ENCOUNTER — Ambulatory Visit (INDEPENDENT_AMBULATORY_CARE_PROVIDER_SITE_OTHER): Payer: Medicare Other | Admitting: Internal Medicine

## 2012-01-20 ENCOUNTER — Encounter: Payer: Self-pay | Admitting: Internal Medicine

## 2012-01-20 VITALS — BP 162/75 | HR 67 | Temp 98.5°F | Wt 301.1 lb

## 2012-01-20 DIAGNOSIS — L03119 Cellulitis of unspecified part of limb: Secondary | ICD-10-CM

## 2012-01-20 NOTE — Progress Notes (Signed)
Patient ID: Andre Hunter, male   DOB: March 08, 1946, 66 y.o.   MRN: [id number]    [facility] for Infectious Disease  Patient Active Problem List  Diagnosis  . NEVI, MULTIPLE  . DIABETES MELLITUS, TYPE II  . HYPERLIPIDEMIA  . OBESITY  . UNSPECIFIED ANEMIA  . HYPERTENSION  . ASTHMA  . GERD  . Recurrent cellulitis of lower leg  . EDEMA  . PVD (peripheral vascular disease)  . Venous stasis dermatitis  . Onychomycosis  . Dyspnea on exertion  . Venous stasis dermatitis, left    Patient's Medications  New Prescriptions   No medications on file  Previous Medications   ALBUTEROL SULFATE IN    Inhale 2 puffs into the lungs as needed.   AMLODIPINE (NORVASC) 10 MG TABLET    Take 10 mg by mouth daily.     ASPIRIN 81 MG TABLET    Take 81 mg by mouth daily.   ATORVASTATIN (LIPITOR) 20 MG TABLET    Take 20 mg by mouth daily.   FISH OIL-OMEGA-3 FATTY ACIDS 1000 MG CAPSULE    Take 2 g by mouth daily.     FORMOTEROL (FORADIL) 12 MCG CAPSULE FOR INHALER    Place 12 mcg into inhaler and inhale 2 (two) times daily.   FUROSEMIDE (LASIX) 20 MG TABLET    Take 40 mg by mouth daily.    GABAPENTIN (NEURONTIN) 300 MG CAPSULE    Take 900 mg by mouth daily.   HYDROCODONE-ACETAMINOPHEN (NORCO) 5-325 MG PER TABLET       HYDROXYZINE (ATARAX/VISTARIL) 10 MG TABLET    Take 10 mg by mouth 3 (three) times daily.   INSULIN LISPRO (HUMALOG) 100 UNIT/ML INJECTION    Inject into the skin 3 (three) times daily before meals.     LIRAGLUTIDE (VICTOZA) 18 MG/3ML SOLN    Inject into the skin.   LISINOPRIL (PRINIVIL,ZESTRIL) 10 MG TABLET    Take 10 mg by mouth daily.     MOMETASONE (ASMANEX) 220 MCG/INH INHALER    Inhale 2 puffs into the lungs 2 (two) times daily.   SIMVASTATIN (ZOCOR) 80 MG TABLET    Take 80 mg by mouth at bedtime.     TIMOLOL (BETIMOL) 0.25 % OPHTHALMIC SOLUTION    1-2 drops 2 (two) times daily.    Modified Medications   No medications on file  Discontinued Medications   DOXYCYCLINE (VIBRA-TABS)  100 MG TABLET    TAKE 1 TABLET (100 MG TOTAL) BY MOUTH 2 (TWO) TIMES DAILY.    Subjective: [name] is in for his routine visit. He is now completed 14 days of doxycycline and feels like he is doing better. He is having less pain. He feels like the ulcer over his left shin is healing nicely. He was given some tramadol by his primary care physician but states that it caused some nausea so he started taking some hydrocodone that his wife had been prescribed. He states that that helps with his pain.  Objective: Temp: 98.5 F (36.9 C) (06/24 0949) Temp src: Oral (06/24 0949) BP: 162/75 mmHg (06/24 0949) Pulse Rate: 67  (06/24 0949)  General: Is in good spirits  Skin: He has chronic venous stasis changes of both lower legs. A small ulcer on his left calf has resolved. The ulcer over his left shin persists unchanged. I see no signs of cellulitis or other infection.    Assessment: His left leg cellulitis has resolved.   Plan: 1. Observe off of antibiotics 2.  I suggested that he let his primary care physician know about his use of hydrocodone  3. Recommend followup at the wound Center if the left leg ulcer does not heal quickly 4. Followup here as needed   [name], MD [facility] for Infectious Disease [facility] [phone] pager   [phone] cell 01/20/2012, 10:15 AM

## 2012-04-03 IMAGING — CR DG CHEST 1V PORT
1 series · 1 of 1 positions shown · non-contrast
Comparison: 12/08/2008

CLINICAL DATA: Short of breath

PORTABLE CHEST - 1 VIEW

[AP]
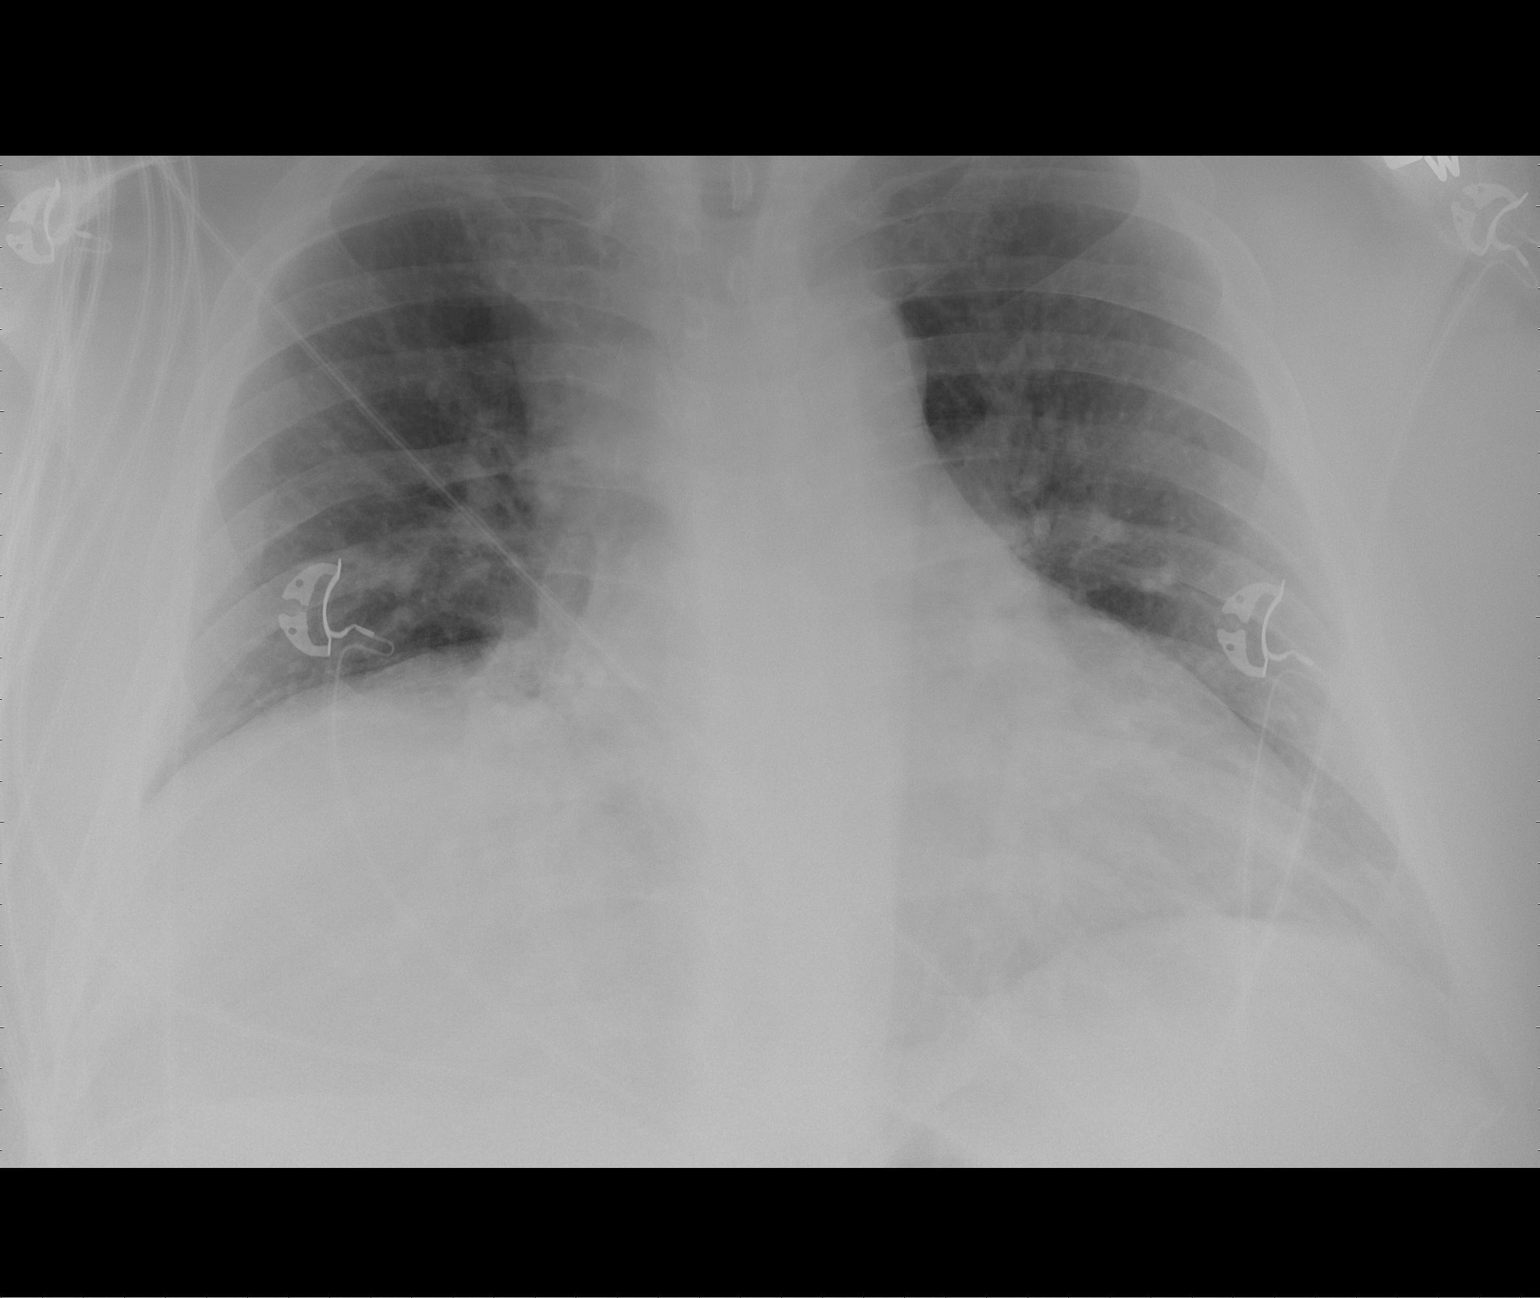

[1 of 1 positions shown; findings below may reference images not displayed]

FINDINGS: Low lung volumes.  Bibasilar atelectasis.  Mild
cardiomegaly.  Vascular congestion.  No pneumothorax or pleural
effusion.
IMPRESSION: Mild cardiomegaly and vascular congestion.

Bibasilar atelectasis with low lung volumes.

## 2012-04-03 IMAGING — NM NM PULMONARY VENT & PERF
2 series · 12 of 12 positions shown · non-contrast
Comparison: Chest x-ray from 01/14/2011

CLINICAL DATA: Shortness of breath

NUCLEAR MEDICINE VENTILATION - PERFUSION LUNG SCAN
TECHNIQUE: Wash-in, equilibrium, and wash-out phase ventilation
images were obtained using Je-499 gas.  Perfusion images were
obtained in multiple projections after intravenous injection of Tc-
99m MAA.
Radiopharmaceuticals:  10 mCi Je-499 gas and 6 mCi Ic-33m MAA.

[vq scan · 2.52mm/px · 6 of 20 frames shown (1 of 2)]
[frame 2/20  full-range]
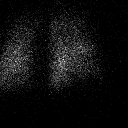
[frame 5/20  full-range]
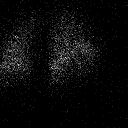
[frame 9/20]
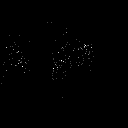
[frame 12/20]
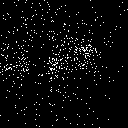
[frame 15/20]
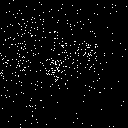
[frame 19/20]
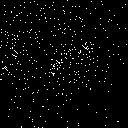

[vq scan · 2.52mm/px · 6 of 20 frames shown (2 of 2)]
[frame 2/20  full-range]
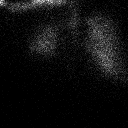
[frame 5/20  full-range]
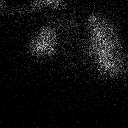
[frame 9/20  full-range]
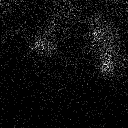
[frame 12/20]
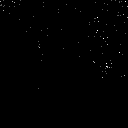
[frame 15/20]
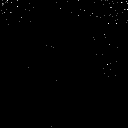
[frame 19/20  full-range]
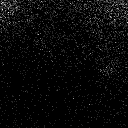

[12 of 12 positions shown; findings below may reference images not displayed]

FINDINGS: On the perfusion portion of the examination there is
asymmetric decreased right lung volume corresponding to marked
asymmetric elevation of the right hemidiaphragm as seen on chest
radiograph.

There are no medium or large size segmental perfusion defects.

On the ventilation portion of the examination there is mild xenon
tracer retention within the lung bases.
IMPRESSION: 1.  Low probability for acute pulmonary embolus.
2.  Chronic asymmetric elevation of the right hemidiaphragm.

## 2012-11-12 ENCOUNTER — Encounter (HOSPITAL_BASED_OUTPATIENT_CLINIC_OR_DEPARTMENT_OTHER): Payer: Self-pay

## 2012-11-12 ENCOUNTER — Emergency Department (HOSPITAL_BASED_OUTPATIENT_CLINIC_OR_DEPARTMENT_OTHER)
Admission: EM | Admit: 2012-11-12 | Discharge: 2012-11-12 | Disposition: A | Discharge status: Other Acute Inpatient Hospital | Payer: Medicare Other | Attending: Emergency Medicine | Admitting: Emergency Medicine

## 2012-11-12 ENCOUNTER — Emergency Department (HOSPITAL_BASED_OUTPATIENT_CLINIC_OR_DEPARTMENT_OTHER): Payer: Medicare Other

## 2012-11-12 DIAGNOSIS — E669 Obesity, unspecified: Secondary | ICD-10-CM | POA: Insufficient documentation

## 2012-11-12 DIAGNOSIS — L0390 Cellulitis, unspecified: Secondary | ICD-10-CM

## 2012-11-12 DIAGNOSIS — Z79899 Other long term (current) drug therapy: Secondary | ICD-10-CM | POA: Insufficient documentation

## 2012-11-12 DIAGNOSIS — J189 Pneumonia, unspecified organism: Secondary | ICD-10-CM | POA: Insufficient documentation

## 2012-11-12 DIAGNOSIS — M549 Dorsalgia, unspecified: Secondary | ICD-10-CM | POA: Insufficient documentation

## 2012-11-12 DIAGNOSIS — R52 Pain, unspecified: Secondary | ICD-10-CM | POA: Insufficient documentation

## 2012-11-12 DIAGNOSIS — Z7982 Long term (current) use of aspirin: Secondary | ICD-10-CM | POA: Insufficient documentation

## 2012-11-12 DIAGNOSIS — M6281 Muscle weakness (generalized): Secondary | ICD-10-CM | POA: Insufficient documentation

## 2012-11-12 DIAGNOSIS — G4730 Sleep apnea, unspecified: Secondary | ICD-10-CM | POA: Insufficient documentation

## 2012-11-12 DIAGNOSIS — R6883 Chills (without fever): Secondary | ICD-10-CM | POA: Insufficient documentation

## 2012-11-12 DIAGNOSIS — Z87448 Personal history of other diseases of urinary system: Secondary | ICD-10-CM | POA: Insufficient documentation

## 2012-11-12 DIAGNOSIS — R300 Dysuria: Secondary | ICD-10-CM | POA: Insufficient documentation

## 2012-11-12 DIAGNOSIS — Z87891 Personal history of nicotine dependence: Secondary | ICD-10-CM | POA: Insufficient documentation

## 2012-11-12 DIAGNOSIS — R339 Retention of urine, unspecified: Secondary | ICD-10-CM | POA: Insufficient documentation

## 2012-11-12 DIAGNOSIS — Z8679 Personal history of other diseases of the circulatory system: Secondary | ICD-10-CM | POA: Insufficient documentation

## 2012-11-12 DIAGNOSIS — Z794 Long term (current) use of insulin: Secondary | ICD-10-CM | POA: Insufficient documentation

## 2012-11-12 DIAGNOSIS — E785 Hyperlipidemia, unspecified: Secondary | ICD-10-CM | POA: Insufficient documentation

## 2012-11-12 DIAGNOSIS — L02419 Cutaneous abscess of limb, unspecified: Secondary | ICD-10-CM | POA: Insufficient documentation

## 2012-11-12 DIAGNOSIS — E119 Type 2 diabetes mellitus without complications: Secondary | ICD-10-CM | POA: Insufficient documentation

## 2012-11-12 DIAGNOSIS — I10 Essential (primary) hypertension: Secondary | ICD-10-CM | POA: Insufficient documentation

## 2012-11-12 DIAGNOSIS — Z9189 Other specified personal risk factors, not elsewhere classified: Secondary | ICD-10-CM | POA: Insufficient documentation

## 2012-11-12 DIAGNOSIS — A419 Sepsis, unspecified organism: Secondary | ICD-10-CM | POA: Insufficient documentation

## 2012-11-12 HISTORY — DX: Essential (primary) hypertension: I10

## 2012-11-12 HISTORY — DX: Acute kidney failure, unspecified: N17.9

## 2012-11-12 HISTORY — DX: Sleep apnea, unspecified: G47.30

## 2012-11-12 HISTORY — DX: Disorder of kidney and ureter, unspecified: N28.9

## 2012-11-12 LAB — CBC WITH DIFFERENTIAL/PLATELET
Band Neutrophils: 36 % — ABNORMAL HIGH (ref 0–10)
Basophils Absolute: 0 10*3/uL (ref 0.0–0.1)
Basophils Relative: 0 % (ref 0–1)
Blasts: 0 %
HCT: 46.5 % (ref 39.0–52.0)
Hemoglobin: 15.8 g/dL (ref 13.0–17.0)
Lymphocytes Relative: 2 % — ABNORMAL LOW (ref 12–46)
Lymphs Abs: 0.5 10*3/uL — ABNORMAL LOW (ref 0.7–4.0)
MCHC: 34 g/dL (ref 30.0–36.0)
MCV: 92.8 fL (ref 78.0–100.0)
Metamyelocytes Relative: 0 %
Monocytes Absolute: 0.2 10*3/uL (ref 0.1–1.0)
Monocytes Relative: 1 % — ABNORMAL LOW (ref 3–12)
RDW: 15.2 % (ref 11.5–15.5)
WBC Morphology: INCREASED

## 2012-11-12 LAB — URINALYSIS, ROUTINE W REFLEX MICROSCOPIC
Glucose, UA: NEGATIVE mg/dL
Hgb urine dipstick: NEGATIVE
Ketones, ur: 15 mg/dL — AB
Protein, ur: 30 mg/dL — AB
pH: 5.5 (ref 5.0–8.0)

## 2012-11-12 LAB — POCT I-STAT 3, ART BLOOD GAS (G3+)
O2 Saturation: 82 %
Patient temperature: 98.1
TCO2: 30 mmol/L (ref 0–100)
pH, Arterial: 7.428 (ref 7.350–7.450)

## 2012-11-12 LAB — LACTIC ACID, PLASMA: Lactic Acid, Venous: 2.8 mmol/L — ABNORMAL HIGH (ref 0.5–2.2)

## 2012-11-12 LAB — COMPREHENSIVE METABOLIC PANEL
AST: 29 U/L (ref 0–37)
Albumin: 3.1 g/dL — ABNORMAL LOW (ref 3.5–5.2)
BUN: 27 mg/dL — ABNORMAL HIGH (ref 6–23)
Calcium: 9.3 mg/dL (ref 8.4–10.5)
Creatinine, Ser: 2 mg/dL — ABNORMAL HIGH (ref 0.50–1.35)
Total Protein: 6 g/dL (ref 6.0–8.3)

## 2012-11-12 LAB — PRO B NATRIURETIC PEPTIDE: Pro B Natriuretic peptide (BNP): 528 pg/mL — ABNORMAL HIGH (ref 0–125)

## 2012-11-12 LAB — TROPONIN I: Troponin I: <0.3 ng/mL (ref <0.30)

## 2012-11-12 LAB — CK: Total CK: 431 U/L — ABNORMAL HIGH (ref 7–232)

## 2012-11-12 LAB — URINE MICROSCOPIC-ADD ON

## 2012-11-12 MED ORDER — VANCOMYCIN HCL IN DEXTROSE 1-5 GM/200ML-% IV SOLN
1000.0000 mg | Freq: Once | INTRAVENOUS | Status: AC
  Administered 2012-11-12: 1000 mg via INTRAVENOUS
  Filled 2012-11-12: qty 200

## 2012-11-12 MED ORDER — PIPERACILLIN-TAZOBACTAM 3.375 G IVPB 30 MIN
3.3750 g | Freq: Once | INTRAVENOUS | Status: AC
  Administered 2012-11-12: 3.375 g via INTRAVENOUS
  Filled 2012-11-12 (×2): qty 50

## 2012-11-12 MED ORDER — MORPHINE SULFATE 4 MG/ML IJ SOLN
INTRAMUSCULAR | Status: AC
  Administered 2012-11-12: 11:00:00
  Filled 2012-11-12: qty 1

## 2012-11-12 MED ORDER — MORPHINE SULFATE 4 MG/ML IJ SOLN
8.0000 mg | Freq: Once | INTRAMUSCULAR | Status: AC
  Administered 2012-11-12: 4 mg via INTRAVENOUS
  Filled 2012-11-12: qty 1

## 2012-11-12 MED ORDER — SODIUM CHLORIDE 0.9 % IV BOLUS (SEPSIS)
1000.0000 mL | Freq: Once | INTRAVENOUS | Status: AC
  Administered 2012-11-12: 1000 mL via INTRAVENOUS

## 2012-11-12 NOTE — ED Provider Notes (Signed)
History     CSN: [id number]  Arrival date & time 11/12/12  1308   First MD Initiated Contact with Patient 11/12/12 [phone]      Chief Complaint  Patient presents with  . Chills  . Back Pain  . Urinary Retention  . Generalized Body Aches  . Shortness of Breath    (Consider location/radiation/quality/duration/timing/severity/associated sxs/prior treatment) The history is provided by the patient and the spouse.  Andre Hunter is a 67 y.o. male history of diabetes, hyperlipidemia, cellulitis, venous stasis, renal failure, former smoker here with SOB, chills, body aches. Since yesterday he's been having worse chills but no fever. He'll has generalized bodyaches. Took some Advil without any improvement. Moreover he felt worsening of his back pain and diffuse weakness especially in his legs. No history of back surgery or disc problems in the past. Denies having numbness in the legs. He also noticed that his left leg is more red than usual and had a history of cellulitis in that leg previously. Moreover he has chronic shortness of breath that got worse especially with exertion. No history of COPD or CHF. Moreover he also had difficulty urinating and has been urinating less. He said last time he has similar symptoms he had acute renal failure. He has been followup at [facility].   Past Medical History  Diagnosis Date  . History of agent Orange exposure   . Diabetes mellitus, type II, insulin dependent   . Hyperlipidemia   . Cellulitis   . Venous stasis dermatitis   . Sleep apnea   . Renal disorder   . Hypertension   . Renal failure, acute     Past Surgical History  Procedure Laterality Date  . Appendectomy      No family history on file.  History  Substance Use Topics  . Smoking status: Former Smoker    Types: Cigarettes    Quit date: 12/10/2010  . Smokeless tobacco: Not on file  . Alcohol Use: Yes     Comment: occasional      Review of Systems  Respiratory: Positive for  shortness of breath.   Musculoskeletal: Positive for back pain.  Neurological: Positive for weakness.  All other systems reviewed and are negative.    Allergies  Review of patient's allergies indicates no known allergies.  Home Medications   Current Outpatient Rx  Name  Route  Sig  Dispense  Refill  . ALBUTEROL SULFATE IN   Inhalation   Inhale 2 puffs into the lungs as needed.         Marland Kitchen amLODipine (NORVASC) 10 MG tablet   Oral   Take 10 mg by mouth daily.           Marland Kitchen aspirin 81 MG tablet   Oral   Take 81 mg by mouth daily.         Marland Kitchen atorvastatin (LIPITOR) 20 MG tablet   Oral   Take 20 mg by mouth daily.         . fish oil-omega-3 fatty acids 1000 MG capsule   Oral   Take 2 g by mouth daily.           . formoterol (FORADIL) 12 MCG capsule for inhaler   Inhalation   Place 12 mcg into inhaler and inhale 2 (two) times daily.         . furosemide (LASIX) 20 MG tablet   Oral   Take 40 mg by mouth daily.          Marland Kitchen  gabapentin (NEURONTIN) 300 MG capsule   Oral   Take 900 mg by mouth daily.         Marland Kitchen HYDROcodone-acetaminophen (NORCO) 5-325 MG per tablet               . hydrOXYzine (ATARAX/VISTARIL) 10 MG tablet   Oral   Take 10 mg by mouth 3 (three) times daily.         . insulin lispro (HUMALOG) 100 UNIT/ML injection   Subcutaneous   Inject into the skin 3 (three) times daily before meals.           . Liraglutide (VICTOZA) 18 MG/3ML SOLN   Subcutaneous   Inject into the skin.         Marland Kitchen lisinopril (PRINIVIL,ZESTRIL) 10 MG tablet   Oral   Take 10 mg by mouth daily.           . mometasone (ASMANEX) 220 MCG/INH inhaler   Inhalation   Inhale 2 puffs into the lungs 2 (two) times daily.         . simvastatin (ZOCOR) 80 MG tablet   Oral   Take 80 mg by mouth at bedtime.           . timolol (BETIMOL) 0.25 % ophthalmic solution      1-2 drops 2 (two) times daily.             BP 118/60  Pulse 82  Temp(Src) 98.1 F (36.7  C) (Oral)  Resp 20  Ht 5\' 4"  (1.626 m)  Wt 291 lb (131.997 kg)  BMI 49.93 kg/m2  SpO2 94%  Physical Exam  Nursing note and vitals reviewed. Constitutional: He is oriented to person, place, and time.  Chronically ill, slightly tachypneic, uncomfortable, obese    HENT:  Head: Normocephalic.  Mouth/Throat: Oropharynx is clear and moist.  Eyes: Conjunctivae are normal. Pupils are equal, round, and reactive to light.  Neck: Normal range of motion. Neck supple.  Cardiovascular: Normal rate, regular rhythm and normal heart sounds.   Pulmonary/Chest: Effort normal and breath sounds normal. No respiratory distress. He has no wheezes. He has no rales.  Abdominal:  Obese, soft, nontender   Musculoskeletal: Normal range of motion.  L leg with cellulitis in the calf, 2+ edema bilaterally, no calf tenderness. No midline spinal tenderness   Neurological: He is alert and oriented to person, place, and time.  5/5 strength throughout. Neg rhomberg. No pronator drift. Slightly wide based gait but steady on his feet.   Skin: Skin is warm and dry.  Psychiatric: He has a normal mood and affect. His behavior is normal. Judgment and thought content normal.    ED Course  Procedures (including critical care time)  Labs Reviewed  CBC WITH DIFFERENTIAL - Abnormal; Notable for the following:    WBC 22.7 (*)    Lymphocytes Relative 2 (*)    Monocytes Relative 1 (*)    Band Neutrophils 36 (*)    Neutro Abs 22.0 (*)    Lymphs Abs 0.5 (*)    All other components within normal limits  COMPREHENSIVE METABOLIC PANEL - Abnormal; Notable for the following:    Sodium 133 (*)    Chloride 94 (*)    Glucose, Bld 313 (*)    BUN 27 (*)    Creatinine, Ser 2.00 (*)    Albumin 3.1 (*)    ALT 62 (*)    Total Bilirubin 1.7 (*)    GFR calc non Af Amer 33 (*)  GFR calc Af Amer 38 (*)    All other components within normal limits  PRO B NATRIURETIC PEPTIDE - Abnormal; Notable for the following:    Pro B  Natriuretic peptide (BNP) 528.0 (*)    All other components within normal limits  LACTIC ACID, PLASMA - Abnormal; Notable for the following:    Lactic Acid, Venous 2.8 (*)    All other components within normal limits  CK - Abnormal; Notable for the following:    Total CK 431 (*)    All other components within normal limits  POCT I-STAT 3, BLOOD GAS (G3+) - Abnormal; Notable for the following:    pO2, Arterial 45.0 (*)    Bicarbonate 28.9 (*)    Acid-Base Excess 4.0 (*)    All other components within normal limits  CULTURE, BLOOD (ROUTINE X 2)  CULTURE, BLOOD (ROUTINE X 2)  URINE CULTURE  TROPONIN I  URINALYSIS, ROUTINE W REFLEX MICROSCOPIC   Dg Chest 2 View  11/12/2012  *RADIOLOGY REPORT*  Clinical Data: Shortness of breath.  Chills and headache.  CHEST - 2 VIEW  Comparison: PA and lateral chest 12/08/2008 single view chest 01/14/2011.  Findings: Elevation of the right hemidiaphragm is again seen. Streaky basilar opacities appear greater in the right base.  No pneumothorax or pleural fluid.  Heart size normal.  IMPRESSION: Right greater than left streaky opacities could be due to atelectasis or infection.   Original Report Authenticated By: Holley Dexter, M.D.      No diagnosis found.   Date: 11/12/2012  Rate: 72  Rhythm: sinus arrhythmia  QRS Axis: normal  Intervals: normal  ST/T Wave abnormalities: normal  Conduction Disutrbances:none  Narrative Interpretation: sinus arrhythmia new   Old EKG Reviewed: changes noted     MDM  Andre Hunter is a 67 y.o. male here with SOB, myalgias, dec urine output, weakness. SOB can be from new onset CHF vs ACS vs infection. Will get labs, BNP, trop, EKG and CXR. He also has likely viral syndrome and cellulitis that can contribute to his systemic symptoms. I doubt that he has spinal cord compression causing his weakness so doesn't need back imaging. Will reassess.   10:30 AM WBC 22, with 36 % bands. Lactate 2.8. CXR showed possible R  sided pneumonia. Cr at baseline 2.0. CK 431, around baseline. Patient given vanc/zosyn for coverage for pneumonia and cellulitis. I talked to Dr. Yetta Barre at [facility], who accepted the patient.       Richardean Canal, MD 11/12/12 1031

## 2012-11-12 NOTE — ED Notes (Signed)
RT Note: ABG results were given and discussed with MD. Patient was placed on nasal cannula per ABG results at 4lpm. Rt will continue to monitor.

## 2012-11-12 NOTE — ED Notes (Signed)
Pt reports chills that started yesterday associated with generalized body aches, difficulty voiding, back pain and a 6 month history of exertional SHOB.

## 2012-11-13 LAB — URINE CULTURE

## 2012-11-18 LAB — CULTURE, BLOOD (ROUTINE X 2): Culture: NO GROWTH

## 2019-11-08 ENCOUNTER — Other Ambulatory Visit: Payer: Self-pay

## 2019-11-08 ENCOUNTER — Emergency Department (HOSPITAL_BASED_OUTPATIENT_CLINIC_OR_DEPARTMENT_OTHER)
Admission: EM | Admit: 2019-11-08 | Discharge: 2019-11-08 | Disposition: A | Discharge status: Home / Self Care | Payer: Medicare Other | Attending: Emergency Medicine | Admitting: Emergency Medicine

## 2019-11-08 ENCOUNTER — Encounter (HOSPITAL_BASED_OUTPATIENT_CLINIC_OR_DEPARTMENT_OTHER): Payer: Self-pay | Admitting: *Deleted

## 2019-11-08 DIAGNOSIS — I10 Essential (primary) hypertension: Secondary | ICD-10-CM | POA: Diagnosis not present

## 2019-11-08 DIAGNOSIS — Z79899 Other long term (current) drug therapy: Secondary | ICD-10-CM | POA: Diagnosis not present

## 2019-11-08 DIAGNOSIS — E1151 Type 2 diabetes mellitus with diabetic peripheral angiopathy without gangrene: Secondary | ICD-10-CM | POA: Insufficient documentation

## 2019-11-08 DIAGNOSIS — Z7901 Long term (current) use of anticoagulants: Secondary | ICD-10-CM | POA: Insufficient documentation

## 2019-11-08 DIAGNOSIS — S51002A Unspecified open wound of left elbow, initial encounter: Secondary | ICD-10-CM | POA: Diagnosis not present

## 2019-11-08 DIAGNOSIS — Y929 Unspecified place or not applicable: Secondary | ICD-10-CM | POA: Insufficient documentation

## 2019-11-08 DIAGNOSIS — S51012A Laceration without foreign body of left elbow, initial encounter: Secondary | ICD-10-CM

## 2019-11-08 DIAGNOSIS — Y999 Unspecified external cause status: Secondary | ICD-10-CM | POA: Diagnosis not present

## 2019-11-08 DIAGNOSIS — Z794 Long term (current) use of insulin: Secondary | ICD-10-CM | POA: Diagnosis not present

## 2019-11-08 DIAGNOSIS — Y939 Activity, unspecified: Secondary | ICD-10-CM | POA: Diagnosis not present

## 2019-11-08 DIAGNOSIS — E119 Type 2 diabetes mellitus without complications: Secondary | ICD-10-CM | POA: Diagnosis not present

## 2019-11-08 DIAGNOSIS — Z5189 Encounter for other specified aftercare: Secondary | ICD-10-CM

## 2019-11-08 DIAGNOSIS — Z9981 Dependence on supplemental oxygen: Secondary | ICD-10-CM | POA: Diagnosis not present

## 2019-11-08 DIAGNOSIS — Z87891 Personal history of nicotine dependence: Secondary | ICD-10-CM | POA: Diagnosis not present

## 2019-11-08 DIAGNOSIS — W2209XA Striking against other stationary object, initial encounter: Secondary | ICD-10-CM | POA: Diagnosis not present

## 2019-11-08 HISTORY — DX: Chronic obstructive pulmonary disease, unspecified: J44.9

## 2019-11-08 NOTE — ED Triage Notes (Addendum)
Pt c/o lac to left elbow x 1 week , cont to bleed d/t blood htinners

## 2019-11-08 NOTE — Discharge Instructions (Addendum)
Your history and exam are consistent with a skin tear in your arm which bled due to your blood thinner use.  We were able to achieve hemostasis with the gauze and pressure dressing.  Please keep it in place for the next 24 to 48 hours and follow-up with your primary doctor.  Please watch for signs and symptoms of infection.  We washed it and your tetanus is up-to-date as we have a low suspicion for infection developing.  If any symptoms change or worsen or start bleeding again, please return to the nearest emergency department for further evaluation and management.

## 2019-11-08 NOTE — ED Provider Notes (Signed)
[facility] EMERGENCY DEPARTMENT Provider Note   CSN: [id number] Arrival date & time: 11/08/19  1748     History Chief Complaint  Patient presents with  . Laceration    Hulon Herne is a 74 y.o. male.  The history is provided by the patient, the spouse and medical records.  Wound Check This is a new problem. The current episode started more than 2 days ago. The problem has been gradually worsening. Pertinent negatives include no chest pain, no abdominal pain, no headaches and no shortness of breath. Nothing aggravates the symptoms. Nothing relieves the symptoms. He has tried nothing for the symptoms. The treatment provided no relief.       Past Medical History:  Diagnosis Date  . Cellulitis   . COPD (chronic obstructive pulmonary disease) ([place])   . Diabetes mellitus, type II, insulin dependent ([place])   . History of agent Orange exposure   . Hyperlipidemia   . Hypertension   . Renal disorder   . Renal failure, acute ([place])   . Sleep apnea   . Venous stasis dermatitis     Patient Active Problem List   Diagnosis Date Noted  . Venous stasis dermatitis, left 12/31/2011  . Dyspnea on exertion 01/14/2011  . PVD (peripheral vascular disease) ([place]) 12/10/2010  . Venous stasis dermatitis 12/10/2010  . Onychomycosis 12/10/2010  . EDEMA 12/15/2008  . UNSPECIFIED ANEMIA 11/14/2008  . Recurrent cellulitis of lower leg 10/12/2008  . NEVI, MULTIPLE 05/12/2008  . DIABETES MELLITUS, TYPE II 09/09/2007  . HYPERLIPIDEMIA 09/09/2007  . OBESITY 09/09/2007  . HYPERTENSION 09/09/2007  . ASTHMA 09/09/2007  . GERD 09/09/2007    Past Surgical History:  Procedure Laterality Date  . APPENDECTOMY         No family history on file.  Social History   Tobacco Use  . Smoking status: Former Smoker    Types: Cigarettes  Substance Use Topics  . Alcohol use: Yes    Comment: occasional  . Drug use: No    Home Medications Prior to Admission medications   Medication Sig  Start Date End Date Taking? Authorizing Provider  apixaban (ELIQUIS) 5 MG TABS tablet Take by mouth. 08/25/19  Yes [provider]  ALBUTEROL SULFATE IN Inhale 2 puffs into the lungs as needed.    [provider]  amLODipine (NORVASC) 10 MG tablet Take 10 mg by mouth daily.      [provider]  aspirin 81 MG tablet Take 81 mg by mouth daily.    [provider]  atorvastatin (LIPITOR) 20 MG tablet Take 20 mg by mouth daily.    [provider]  fish oil-omega-3 fatty acids 1000 MG capsule Take 2 g by mouth daily.      [provider]  formoterol (FORADIL) 12 MCG capsule for inhaler Place 12 mcg into inhaler and inhale 2 (two) times daily.    [provider]  furosemide (LASIX) 20 MG tablet Take 40 mg by mouth daily.     [provider]  gabapentin (NEURONTIN) 300 MG capsule Take 900 mg by mouth daily.    [provider]  HYDROcodone-acetaminophen ([place]) 5-325 MG per tablet  12/03/10   [provider]  hydrOXYzine (ATARAX/VISTARIL) 10 MG tablet Take 10 mg by mouth 3 (three) times daily.    [provider]  insulin lispro (HUMALOG) 100 UNIT/ML injection Inject into the skin 3 (three) times daily before meals.      [provider]  Liraglutide [facility] Bernard)  18 MG/3ML SOLN Inject into the skin.    [provider]  lisinopril (PRINIVIL,ZESTRIL) 10 MG tablet Take 10 mg by mouth daily.      [provider]  mometasone [facility]) 220 MCG/INH inhaler Inhale 2 puffs into the lungs 2 (two) times daily.    [provider]  simvastatin (ZOCOR) 80 MG tablet Take 80 mg by mouth at bedtime.      [provider]  timolol (BETIMOL) 0.25 % ophthalmic solution 1-2 drops 2 (two) times daily.      [provider]    Allergies    Patient has no known allergies.  Review of Systems   Review of Systems  Constitutional: Negative for chills, diaphoresis and fatigue.   Respiratory: Negative for chest tightness and shortness of breath.   Cardiovascular: Negative for chest pain.  Gastrointestinal: Negative for abdominal pain, constipation, diarrhea and nausea.  Genitourinary: Negative for flank pain.  Skin: Positive for wound.  Neurological: Negative for light-headedness and headaches.  Psychiatric/Behavioral: Negative for agitation.    Physical Exam Updated Vital Signs BP (!) 142/79   Pulse 79   Temp 98.2 F (36.8 C) (Oral)   Resp 20   Ht 5\' 5"  (1.651 m)   Wt 122.9 kg   SpO2 93%   BMI 45.10 kg/m   Physical Exam Vitals and nursing note reviewed.  Constitutional:      General: He is not in acute distress.    Appearance: He is well-developed. He is not ill-appearing, toxic-appearing or diaphoretic.  HENT:     Head: Normocephalic and atraumatic.  Eyes:     Conjunctiva/sclera: Conjunctivae normal.  Cardiovascular:     Rate and Rhythm: Normal rate.  Pulmonary:     Effort: Pulmonary effort is normal.  Abdominal:     General: Abdomen is flat.  Musculoskeletal:        General: Signs of injury (skin tear) present. No tenderness.     Left forearm: Laceration present.       Arms:  Skin:    General: Skin is warm and dry.     Capillary Refill: Capillary refill takes less than 2 seconds.  Neurological:     General: No focal deficit present.     Mental Status: He is alert.  Psychiatric:        Mood and Affect: Mood normal.     ED Results / Procedures / Treatments   Labs (all labs ordered are listed, but only abnormal results are displayed) Labs Reviewed - No data to display  EKG None  Radiology No results found.  Procedures .Marland KitchenLaceration Repair  Date/Time: 11/08/2019 7:48 PM Performed by: Courtney Paris, MD Authorized by: Courtney Paris, MD   Consent:    Consent obtained:  Verbal   Consent given by:  Patient   Risks discussed:  Infection, pain and vascular damage   Alternatives discussed:  No treatment  Anesthesia (see MAR for exact dosages):    Anesthesia method:  None Laceration details:    Location:  Shoulder/arm   Shoulder/arm location:  L lower arm   Length (cm):  6   Depth (mm):  1 Repair type:    Repair type:  Simple (pressure dressing with combat gauze) Pre-procedure details:    Preparation:  Patient was prepped and draped in usual sterile fashion Exploration:    Hemostasis achieved with:  Direct pressure   Wound exploration: wound explored through full range of motion and entire depth of wound probed and visualized  Contaminated: no   Treatment:    Area cleansed with:  Hibiclens   Amount of cleaning:  Standard   Visualized foreign bodies/material removed: no   Skin repair:    Repair method: combat gauze and pressure. Approximation:    Approximation:  Loose Post-procedure details:    Dressing:  Tube gauze   Patient tolerance of procedure:  Tolerated well, no immediate complications   (including critical care time)  Medications Ordered in ED Medications - No data to display  ED Course  I have reviewed the triage vital signs and the nursing notes.  Pertinent labs & imaging results that were available during my care of the patient were reviewed by me and considered in my medical decision making (see chart for details).    MDM Rules/Calculators/A&P                      Emil Stahlnecker is a 74 y.o. male with a past medical history significant for asthma, diabetes, hypertension, COPD on home oxygen, and atrial fibrillation on Eliquis therapy who presents with left forearm bleeding.  Patient reports that 5 days ago, patient bumped into a wall and hit a door handle on his left proximal forearm.  He reports he sustained a 6 cm skin tear that initially bled but then improved.  He reports that today, the most proximal aspect of the wound started bleeding again and soaked through several bandages that the wife placed.  He otherwise denies significant pain, fevers, chills, or  surrounding erythema.  He reports he is still taking his Eliquis for his A. fib and suspect this is the cause of the bleeding.  He denies new trauma.  Denies any other complaints aside from the bleeding that they cannot stop at home.  It is an oozing gentle bleed but is persistent.  He denies other problems.  On exam, wound was examined and cleaned.  Patient has a 6 m skin tear that is bleeding from the most proximal aspect of it.  It is nonpulsatile bleeding.  Wound was washed and patient reports he is up-to-date with his tetanus shot.  A pressure dressing was applied with combat gauze to attempt to achieve hemostasis.  Given the very small pinpoint of bleeding, and his current anticoagulation, will attempt the pressure dressing first before turning towards a figure-of-eight stitch as this may cause more bleeding at this time.  If the bleeding has stopped after a period of monitoring, dissipate discharge with PCP follow-up.  At this time, do not feel patient needs antibiotics as there is no evidence of infection and the wound was washed.  He denies any pain, doubt underlying injury or fracture or foreign body.  Anticipate reassessment and likely discharge shortly.  7:43 PM Patient was observed with no further bleeding.  We suspect hemostasis was achieved.  We feel he safe for discharge with PCP follow-up.  Patient discharged in good condition for outpatient follow-up.  Final Clinical Impression(s) / ED Diagnoses Final diagnoses:  Visit for wound check  Skin tear of left elbow without complication, initial encounter    Rx / DC Orders ED Discharge Orders    None     Clinical Impression: 1. Visit for wound check   2. Skin tear of left elbow without complication, initial encounter     Disposition: Discharge  Condition: Good  I have discussed the results, Dx and Tx plan with the pt(& family if present). He/she/they expressed understanding and agree(s) with the plan. Discharge  instructions discussed at great length. Strict return precautions discussed and pt &/or family have verbalized understanding of the instructions. No further questions at time of discharge.    New Prescriptions   No medications on file    Follow Up: [facility], [place] [address] [phone]     [facility] [address] [id number] [place] [phone]       Tegeler, Gwenyth Allegra, MD 11/08/19 1950

## 2019-12-03 ENCOUNTER — Other Ambulatory Visit: Payer: Self-pay

## 2019-12-03 ENCOUNTER — Encounter (HOSPITAL_BASED_OUTPATIENT_CLINIC_OR_DEPARTMENT_OTHER): Payer: Self-pay

## 2019-12-03 ENCOUNTER — Emergency Department (HOSPITAL_BASED_OUTPATIENT_CLINIC_OR_DEPARTMENT_OTHER): Payer: Medicare Other

## 2019-12-03 ENCOUNTER — Observation Stay (HOSPITAL_BASED_OUTPATIENT_CLINIC_OR_DEPARTMENT_OTHER)
Admission: EM | Admit: 2019-12-03 | Discharge: 2019-12-04 | Disposition: A | Discharge status: Home / Self Care | Payer: Medicare Other | Attending: Internal Medicine | Admitting: Internal Medicine

## 2019-12-03 DIAGNOSIS — N189 Chronic kidney disease, unspecified: Secondary | ICD-10-CM | POA: Diagnosis present

## 2019-12-03 DIAGNOSIS — N183 Chronic kidney disease, stage 3 unspecified: Secondary | ICD-10-CM | POA: Insufficient documentation

## 2019-12-03 DIAGNOSIS — E785 Hyperlipidemia, unspecified: Secondary | ICD-10-CM | POA: Insufficient documentation

## 2019-12-03 DIAGNOSIS — E1151 Type 2 diabetes mellitus with diabetic peripheral angiopathy without gangrene: Secondary | ICD-10-CM | POA: Diagnosis not present

## 2019-12-03 DIAGNOSIS — Z7951 Long term (current) use of inhaled steroids: Secondary | ICD-10-CM | POA: Insufficient documentation

## 2019-12-03 DIAGNOSIS — I951 Orthostatic hypotension: Secondary | ICD-10-CM | POA: Diagnosis not present

## 2019-12-03 DIAGNOSIS — Z7901 Long term (current) use of anticoagulants: Secondary | ICD-10-CM | POA: Diagnosis not present

## 2019-12-03 DIAGNOSIS — J449 Chronic obstructive pulmonary disease, unspecified: Secondary | ICD-10-CM | POA: Insufficient documentation

## 2019-12-03 DIAGNOSIS — G4700 Insomnia, unspecified: Secondary | ICD-10-CM | POA: Insufficient documentation

## 2019-12-03 DIAGNOSIS — Z20822 Contact with and (suspected) exposure to covid-19: Secondary | ICD-10-CM | POA: Diagnosis not present

## 2019-12-03 DIAGNOSIS — Z79899 Other long term (current) drug therapy: Secondary | ICD-10-CM | POA: Insufficient documentation

## 2019-12-03 DIAGNOSIS — Z87891 Personal history of nicotine dependence: Secondary | ICD-10-CM | POA: Diagnosis not present

## 2019-12-03 DIAGNOSIS — E1122 Type 2 diabetes mellitus with diabetic chronic kidney disease: Secondary | ICD-10-CM | POA: Diagnosis not present

## 2019-12-03 DIAGNOSIS — Z794 Long term (current) use of insulin: Secondary | ICD-10-CM | POA: Insufficient documentation

## 2019-12-03 DIAGNOSIS — I4510 Unspecified right bundle-branch block: Secondary | ICD-10-CM | POA: Insufficient documentation

## 2019-12-03 DIAGNOSIS — I129 Hypertensive chronic kidney disease with stage 1 through stage 4 chronic kidney disease, or unspecified chronic kidney disease: Secondary | ICD-10-CM | POA: Diagnosis not present

## 2019-12-03 DIAGNOSIS — N179 Acute kidney failure, unspecified: Secondary | ICD-10-CM | POA: Diagnosis present

## 2019-12-03 DIAGNOSIS — Z85820 Personal history of malignant melanoma of skin: Secondary | ICD-10-CM | POA: Insufficient documentation

## 2019-12-03 DIAGNOSIS — T502X5A Adverse effect of carbonic-anhydrase inhibitors, benzothiadiazides and other diuretics, initial encounter: Secondary | ICD-10-CM | POA: Insufficient documentation

## 2019-12-03 DIAGNOSIS — G4733 Obstructive sleep apnea (adult) (pediatric): Secondary | ICD-10-CM | POA: Diagnosis not present

## 2019-12-03 DIAGNOSIS — I482 Chronic atrial fibrillation, unspecified: Secondary | ICD-10-CM | POA: Insufficient documentation

## 2019-12-03 DIAGNOSIS — J45909 Unspecified asthma, uncomplicated: Secondary | ICD-10-CM | POA: Insufficient documentation

## 2019-12-03 DIAGNOSIS — K219 Gastro-esophageal reflux disease without esophagitis: Secondary | ICD-10-CM | POA: Diagnosis not present

## 2019-12-03 DIAGNOSIS — J9611 Chronic respiratory failure with hypoxia: Secondary | ICD-10-CM | POA: Diagnosis not present

## 2019-12-03 LAB — URINALYSIS, MICROSCOPIC (REFLEX)

## 2019-12-03 LAB — COMPREHENSIVE METABOLIC PANEL
ALT: 46 U/L — ABNORMAL HIGH (ref 0–44)
AST: 27 U/L (ref 15–41)
Albumin: 3.5 g/dL (ref 3.5–5.0)
Alkaline Phosphatase: 124 U/L (ref 38–126)
Anion gap: 14 (ref 5–15)
BUN: 66 mg/dL — ABNORMAL HIGH (ref 8–23)
CO2: 30 mmol/L (ref 22–32)
Calcium: 9.4 mg/dL (ref 8.9–10.3)
Chloride: 98 mmol/L (ref 98–111)
Creatinine, Ser: 3.08 mg/dL — ABNORMAL HIGH (ref 0.61–1.24)
GFR calc Af Amer: 22 mL/min — ABNORMAL LOW (ref >60)
GFR calc non Af Amer: 19 mL/min — ABNORMAL LOW (ref >60)
Glucose, Bld: 76 mg/dL (ref 70–99)
Potassium: 3.6 mmol/L (ref 3.5–5.1)
Sodium: 142 mmol/L (ref 135–145)
Total Bilirubin: 1.1 mg/dL (ref 0.3–1.2)
Total Protein: 6.8 g/dL (ref 6.5–8.1)

## 2019-12-03 LAB — URINALYSIS, ROUTINE W REFLEX MICROSCOPIC
Bilirubin Urine: NEGATIVE
Glucose, UA: NEGATIVE mg/dL
Hgb urine dipstick: NEGATIVE
Ketones, ur: NEGATIVE mg/dL
Nitrite: NEGATIVE
Protein, ur: NEGATIVE mg/dL
Specific Gravity, Urine: 1.01 (ref 1.005–1.030)
pH: 7 (ref 5.0–8.0)

## 2019-12-03 LAB — CBC WITH DIFFERENTIAL/PLATELET
Abs Immature Granulocytes: 0.05 10*3/uL (ref 0.00–0.07)
Basophils Absolute: 0 10*3/uL (ref 0.0–0.1)
Basophils Relative: 0 %
Eosinophils Absolute: 0.3 10*3/uL (ref 0.0–0.5)
Eosinophils Relative: 3 %
HCT: 50.7 % (ref 39.0–52.0)
Hemoglobin: 16.8 g/dL (ref 13.0–17.0)
Immature Granulocytes: 1 %
Lymphocytes Relative: 10 %
Lymphs Abs: 0.9 10*3/uL (ref 0.7–4.0)
MCH: 31.5 pg (ref 26.0–34.0)
MCHC: 33.1 g/dL (ref 30.0–36.0)
MCV: 94.9 fL (ref 80.0–100.0)
Monocytes Absolute: 1 10*3/uL (ref 0.1–1.0)
Monocytes Relative: 12 %
Neutro Abs: 6.1 10*3/uL (ref 1.7–7.7)
Neutrophils Relative %: 74 %
Platelets: 167 10*3/uL (ref 150–400)
RBC: 5.34 MIL/uL (ref 4.22–5.81)
RDW: 14.4 % (ref 11.5–15.5)
WBC: 8.3 10*3/uL (ref 4.0–10.5)
nRBC: 0 % (ref 0.0–0.2)

## 2019-12-03 LAB — GLUCOSE, CAPILLARY
Glucose-Capillary: 189 mg/dL — ABNORMAL HIGH (ref 70–99)
Glucose-Capillary: 182 mg/dL — ABNORMAL HIGH (ref 70–99)

## 2019-12-03 LAB — LACTIC ACID, PLASMA: Lactic Acid, Venous: 1.5 mmol/L (ref 0.5–1.9)

## 2019-12-03 LAB — SARS CORONAVIRUS 2 (TAT 6-24 HRS): SARS Coronavirus 2: NEGATIVE

## 2019-12-03 LAB — CBG MONITORING, ED: Glucose-Capillary: 84 mg/dL (ref 70–99)

## 2019-12-03 MED ORDER — ACETAMINOPHEN 325 MG PO TABS: 650.0000 mg | ORAL_TABLET | Freq: Four times a day (QID) | ORAL | Status: DC | PRN

## 2019-12-03 MED ORDER — MOMETASONE FURO-FORMOTEROL FUM 200-5 MCG/ACT IN AERO
2.0000 | INHALATION_SPRAY | Freq: Two times a day (BID) | RESPIRATORY_TRACT | Status: DC
  Administered 2019-12-04: 2 via RESPIRATORY_TRACT
  Filled 2019-12-03: qty 8.8

## 2019-12-03 MED ORDER — APIXABAN 5 MG PO TABS
5.0000 mg | ORAL_TABLET | Freq: Two times a day (BID) | ORAL | Status: DC
  Administered 2019-12-04: 5 mg via ORAL
  Filled 2019-12-03: qty 1

## 2019-12-03 MED ORDER — ACETAMINOPHEN 650 MG RE SUPP: 650.0000 mg | Freq: Four times a day (QID) | RECTAL | Status: DC | PRN

## 2019-12-03 MED ORDER — HEPARIN SODIUM (PORCINE) 5000 UNIT/ML IJ SOLN
5000.0000 [IU] | Freq: Three times a day (TID) | INTRAMUSCULAR | Status: DC
  Administered 2019-12-03: 5000 [IU] via SUBCUTANEOUS
  Filled 2019-12-03: qty 1

## 2019-12-03 MED ORDER — SODIUM CHLORIDE 0.9 % IV BOLUS
500.0000 mL | Freq: Once | INTRAVENOUS | Status: AC
  Administered 2019-12-03: 500 mL via INTRAVENOUS

## 2019-12-03 MED ORDER — TRAMADOL HCL 50 MG PO TABS
50.0000 mg | ORAL_TABLET | Freq: Every day | ORAL | Status: DC
  Administered 2019-12-03: 22:00:00 50 mg via ORAL
  Filled 2019-12-03: qty 1

## 2019-12-03 MED ORDER — ALBUTEROL SULFATE (2.5 MG/3ML) 0.083% IN NEBU: 2.5000 mg | INHALATION_SOLUTION | Freq: Four times a day (QID) | RESPIRATORY_TRACT | Status: DC | PRN

## 2019-12-03 MED ORDER — SODIUM CHLORIDE 0.9 % IV SOLN: INTRAVENOUS | Status: DC

## 2019-12-03 MED ORDER — HYDRALAZINE HCL 25 MG PO TABS: 25.0000 mg | ORAL_TABLET | ORAL | Status: DC | PRN

## 2019-12-03 MED ORDER — TIMOLOL MALEATE 0.25 % OP SOLN
1.0000 [drp] | Freq: Two times a day (BID) | OPHTHALMIC | Status: DC
  Administered 2019-12-03: 2 [drp] via OPHTHALMIC
  Filled 2019-12-03: qty 5

## 2019-12-03 MED ORDER — LABETALOL HCL 5 MG/ML IV SOLN: 10.0000 mg | INTRAVENOUS | Status: DC | PRN

## 2019-12-03 MED ORDER — INSULIN ASPART 100 UNIT/ML ~~LOC~~ SOLN
0.0000 [IU] | Freq: Three times a day (TID) | SUBCUTANEOUS | Status: DC
  Administered 2019-12-03: 22:00:00 3 [IU] via SUBCUTANEOUS

## 2019-12-03 MED ORDER — LATANOPROST 0.005 % OP SOLN
1.0000 [drp] | Freq: Every day | OPHTHALMIC | Status: DC
  Administered 2019-12-03: 1 [drp] via OPHTHALMIC
  Filled 2019-12-03: qty 2.5

## 2019-12-03 MED ORDER — SODIUM CHLORIDE 0.9 % IV SOLN
1.0000 g | Freq: Once | INTRAVENOUS | Status: AC
  Administered 2019-12-03: 1 g via INTRAVENOUS
  Filled 2019-12-03: qty 10

## 2019-12-03 NOTE — Assessment & Plan Note (Deleted)
-  baseline creat approx 1.8 - 2, presents with creatinine 3.08 -suspicion is overdiuresis and/or hypotension due to overmedication

## 2019-12-03 NOTE — H&P (Addendum)
History and Physical    Andre Hunter [id number] DOB: 1946-06-03 DOA: 12/03/2019  PCP: Imagene Riches, MD Patient coming from: [facility] via home  Chief Complaint: hypotension and dizziness  I have personally briefly reviewed patient's old medical records in [facility]  HPI:  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 He had called his PCP with his low BP and sxms and was told to present to come to the hospital.  Of note, he was also seen in the office on 11/23/2019.  At that time, due to complaints of lightheadedness he was taken off of his amlodipine and continued on Bumex, metolazone, hydralazine, losartan, and metoprolol. He still notes having low blood pressures at home and due to his symptoms today he presented for further evaluation.  On work-up at urgent care, his blood pressure was 115/65 followed by 96/55.  He also underwent orthostatic blood pressure check which was positive.  Other notable labs include: creatinine 3.08, BUN 66 WBC 8.3, no neutrophilia or bandemia UA notable for large leukocytes, 21-50 WBC, many bacteria.  Patient making normal urine with no dysuria, frequency, hesitancy.  Lactic acid 1.5  Due to his orthostasis, acute on chronic renal failure, and questionable infection, he was transferred for further treatment and evaluation. Furthermore, he has denied fevers, chills, chest pain, worsening shortness of breath.  Of note, he does use BiPAP at night for his OSA per his wife who is bedside.  She also states that he is supposed to wear his nasal cannula oxygen more during the day however he does not always do this.    Assessment/Plan: Orthostatic hypotension - appears to be due to overmedication from his  antihypertensive regimen.  He was recently taken off of amlodipine on 11/23/2019 which he has been compliant with.  Med list reviewed with patient and his wife bedside on admission and he is however still taking losartan, metoprolol, hydralazine, Bumex, metolazone -Orthostatic blood pressure check was positive at [facility] - start on NS@75  and repeat orthostatics in am - hold all home BP meds - use PRN labetalol or hydralazine if BP elevates  Acute on chronic kidney disease, stage III -Follows outpatient with nephrology -Likely is over diuresed as he was recently taken off of Lasix and started on Bumex twice daily as well as metolazone every other day (change made approximately 4 to 6 weeks ago per wife). Biggest concern would be prolonged prerenal causing ATN, but will see how renal function responds to IVF overnight -Hold Bumex and metolazone.  Recent echo was performed on 09/21/2019 with normal EF, 60 to 65% and indeterminate filling pattern; his swelling in his legs are now minimal and back to his baseline he states - continue IVF and repeat BMP in am  Asymptomatic bacteriuria -Patient is not septic nor toxic appearing.  His hypotension is considered due to overdiuresis and multiple antihypertensive agents -Lactic acid normal, afebrile, no leukocytosis -Received 1 dose Rocephin at [facility] -No further antibiotics nor workup indicated  Hypertension -Blood pressure meds on hold in setting of orthostatic hypotension  Chronic afib - continue Eliquis -Monitor on telemetry -If rate becomes uncontrolled, will resume Lopressor  OSA Chronic hypoxic respiratory failure COPD - RT eval - continue home BiPAP  - wife states he is supposed to be on  3L [place] during the day mostly also - albuterol neb PRN  Insomnia -Patient takes tramadol at night to help with sleep as well as some neuropathy pain he states.  Verified on database -Continue tramadol -Gabapentin on hold in setting of AKI  Type 2  diabetes -Continue on SSI and CBG monitoring -Follow-up A1c  Code Status:   Code Status: Full Code DVT Prophylaxis: unfractionated SQ heparin 5000 units 2 hours prior to surgery then every 12 hours Anticipated disposition is to home 1-2 days  History: Past Medical History:  Diagnosis Date  . Cellulitis   . COPD (chronic obstructive pulmonary disease) ([place])   . Diabetes mellitus, type II, insulin dependent ([place])   . History of agent Orange exposure   . Hyperlipidemia   . Hypertension   . Renal disorder   . Renal failure, acute ([place])   . Sleep apnea   . Venous stasis dermatitis     Past Surgical History:  Procedure Laterality Date  . APPENDECTOMY       reports that he has quit smoking. His smoking use included cigarettes. He has never used smokeless tobacco. He reports previous alcohol use. He reports that he does not use drugs.  Allergies  Allergen Reactions  . Doxycycline Nausea And Vomiting    Family History  Problem Relation Age of Onset  . COPD Mother   . Hypertension Father    Home Medications: Prior to Admission medications   Medication Sig Start Date End Date Taking? Authorizing Provider  ALBUTEROL SULFATE IN Inhale 2 puffs into the lungs as needed.   Yes [provider]  apixaban (ELIQUIS) 5 MG TABS tablet Take by mouth. 08/25/19  Yes [provider]  atorvastatin (LIPITOR) 20 MG tablet Take 20 mg by mouth daily.   Yes [provider]  budesonide-formoterol (SYMBICORT) 160-4.5 MCG/ACT inhaler Inhale 2 puffs into the lungs 2 (two) times daily.   Yes [provider]  bumetanide (BUMEX) 1 MG tablet Take 1 mg by mouth 2 (two) times daily. 0900 and 2 pm   Yes [provider]  fish oil-omega-3 fatty acids 1000 MG capsule Take 2 g by mouth daily.     Yes [provider]  gabapentin (NEURONTIN) 300 MG capsule Take 900 mg by mouth daily.   Yes [provider]  hydrALAZINE (APRESOLINE) 50 MG tablet Take 50 mg  by mouth in the morning and at bedtime.   Yes [provider]  hydrOXYzine (ATARAX/VISTARIL) 10 MG tablet Take 10 mg by mouth 3 (three) times daily.   Yes [provider]  insulin lispro (HUMALOG) 100 UNIT/ML injection Inject into the skin 3 (three) times daily before meals.     Yes [provider]  latanoprost (XALATAN) 0.005 % ophthalmic solution 1 drop at bedtime. Both eyes   Yes [provider]  losartan (COZAAR) 100 MG tablet Take 100 mg by mouth daily.   Yes [provider]  metolazone (ZAROXOLYN) 2.5 MG tablet Take 2.5 mg by mouth every other day. Odd days   Yes [provider]  metoprolol tartrate (LOPRESSOR) 25 MG tablet Take 25 mg by mouth 2 (two) times daily.   Yes [provider]  montelukast (SINGULAIR) 10 MG tablet Take 10 mg by mouth at bedtime.   Yes [provider]  timolol (BETIMOL) 0.25 % ophthalmic solution 1-2 drops 2 (two) times daily.     Yes [provider]  traMADol (ULTRAM) 50 MG tablet Take by mouth daily.  Yes [provider]  mometasone (ASMANEX) 220 MCG/INH inhaler Inhale 2 puffs into the lungs 2 (two) times daily.    [provider]    Review of Systems:  Review of Systems - General ROS: negative for - chills, fatigue or fever Respiratory ROS: negative for - cough or sputum changes Cardiovascular ROS: negative for - chest pain Gastrointestinal ROS: negative for - abdominal pain Genito-Urinary ROS: negative for - change in urinary stream, dysuria or hematuria  Physical Exam: Vitals:   12/03/19 1630 12/03/19 1700 12/03/19 1730 12/03/19 1858  BP: 112/86 131/67 131/85 140/71  Pulse: 69 70 74 80  Resp: (!) 22 19 17 20   Temp:      TempSrc:      SpO2: 100% 100% 99% 98%  Weight:      Height:       General appearance: alert, cooperative and no distress Head: Normocephalic, without obvious abnormality Eyes: EOMI Lungs: bibasilar crackles, no wheezing Heart:  irregularly irregular rhythm and S1, S2 normal Abdomen: obese, soft, NT, ND, BS present Extremities: trace - 1+ LE edema Skin: scattered lesions and minor scratches; old scars noted in LE Neurologic: Grossly normal  Labs on Admission:  I have personally reviewed following labs and imaging studies Results for orders placed or performed during the hospital encounter of 12/03/19 (from the past 24 hour(s))  Comprehensive metabolic panel     Status: Abnormal   Collection Time: 12/03/19  1:05 PM  Result Value Ref Range   Sodium 142 135 - 145 mmol/L   Potassium 3.6 3.5 - 5.1 mmol/L   Chloride 98 98 - 111 mmol/L   CO2 30 22 - 32 mmol/L   Glucose, Bld 76 70 - 99 mg/dL   BUN 66 (H) 8 - 23 mg/dL   Creatinine, Ser 3.08 (H) 0.61 - 1.24 mg/dL   Calcium 9.4 8.9 - 10.3 mg/dL   Total Protein 6.8 6.5 - 8.1 g/dL   Albumin 3.5 3.5 - 5.0 g/dL   AST 27 15 - 41 U/L   ALT 46 (H) 0 - 44 U/L   Alkaline Phosphatase 124 38 - 126 U/L   Total Bilirubin 1.1 0.3 - 1.2 mg/dL   GFR calc non Af Amer 19 (L) >60 mL/min   GFR calc Af Amer 22 (L) >60 mL/min   Anion gap 14 5 - 15  CBC with Differential     Status: None   Collection Time: 12/03/19  1:05 PM  Result Value Ref Range   WBC 8.3 4.0 - 10.5 K/uL   RBC 5.34 4.22 - 5.81 MIL/uL   Hemoglobin 16.8 13.0 - 17.0 g/dL   HCT 50.7 39.0 - 52.0 %   MCV 94.9 80.0 - 100.0 fL   MCH 31.5 26.0 - 34.0 pg   MCHC 33.1 30.0 - 36.0 g/dL   RDW 14.4 11.5 - 15.5 %   Platelets 167 150 - 400 K/uL   nRBC 0.0 0.0 - 0.2 %   Neutrophils Relative % 74 %   Neutro Abs 6.1 1.7 - 7.7 K/uL   Lymphocytes Relative 10 %   Lymphs Abs 0.9 0.7 - 4.0 K/uL   Monocytes Relative 12 %   Monocytes Absolute 1.0 0.1 - 1.0 K/uL   Eosinophils Relative 3 %   Eosinophils Absolute 0.3 0.0 - 0.5 K/uL   Basophils Relative 0 %   Basophils Absolute 0.0 0.0 - 0.1 K/uL   Immature Granulocytes 1 %   Abs Immature Granulocytes 0.05 0.00 - 0.07 K/uL  POC CBG, ED     Status: None   Collection Time: 12/03/19   2:12 PM  Result Value Ref Range   Glucose-Capillary 84 70 - 99 mg/dL  Urinalysis, Routine w reflex microscopic     Status: Abnormal   Collection Time: 12/03/19  2:33 PM  Result Value Ref Range   Color, Urine YELLOW YELLOW   APPearance CLEAR CLEAR   Specific Gravity, Urine 1.010 1.005 - 1.030   pH 7.0 5.0 - 8.0   Glucose, UA NEGATIVE NEGATIVE mg/dL   Hgb urine dipstick NEGATIVE NEGATIVE   Bilirubin Urine NEGATIVE NEGATIVE   Ketones, ur NEGATIVE NEGATIVE mg/dL   Protein, ur NEGATIVE NEGATIVE mg/dL   Nitrite NEGATIVE NEGATIVE   Leukocytes,Ua LARGE (A) NEGATIVE  Urinalysis, Microscopic (reflex)     Status: Abnormal   Collection Time: 12/03/19  2:33 PM  Result Value Ref Range   RBC / HPF 0-5 0 - 5 RBC/hpf   WBC, UA 21-50 0 - 5 WBC/hpf   Bacteria, UA MANY (A) NONE SEEN   Squamous Epithelial / LPF 0-5 0 - 5  Lactic acid, plasma     Status: None   Collection Time: 12/03/19  3:15 PM  Result Value Ref Range   Lactic Acid, Venous 1.5 0.5 - 1.9 mmol/L     CBC: Recent Labs  Lab 12/03/19 1305  WBC 8.3  NEUTROABS 6.1  HGB 16.8  HCT 50.7  MCV 94.9  PLT [id number]   Basic Metabolic Panel: Recent Labs  Lab 12/03/19 1305  NA 142  K 3.6  CL 98  CO2 30  GLUCOSE 76  BUN 66*  CREATININE 3.08*  CALCIUM 9.4   GFR: Estimated Creatinine Clearance: 25.1 mL/min (A) (by C-G formula based on SCr of 3.08 mg/dL (H)). Liver Function Tests: Recent Labs  Lab 12/03/19 1305  AST 27  ALT 46*  ALKPHOS 124  BILITOT 1.1  PROT 6.8  ALBUMIN 3.5   No results for input(s): LIPASE, AMYLASE in the last 168 hours. No results for input(s): AMMONIA in the last 168 hours. Coagulation Profile: No results for input(s): INR, PROTIME in the last 168 hours. Cardiac Enzymes: No results for input(s): CKTOTAL, CKMB, CKMBINDEX, TROPONINI in the last 168 hours. BNP (last 3 results) No results for input(s): PROBNP in the last 8760 hours. HbA1C: No results for input(s): HGBA1C in the last 72  hours. CBG: Recent Labs  Lab 12/03/19 1412  GLUCAP 84   Lipid Profile: No results for input(s): CHOL, HDL, LDLCALC, TRIG, CHOLHDL, LDLDIRECT in the last 72 hours. Thyroid Function Tests: No results for input(s): TSH, T4TOTAL, FREET4, T3FREE, THYROIDAB in the last 72 hours. Anemia Panel: No results for input(s): VITAMINB12, FOLATE, FERRITIN, TIBC, IRON, RETICCTPCT in the last 72 hours. Urine analysis:    Component Value Date/Time   COLORURINE YELLOW 12/03/2019 1433   APPEARANCEUR CLEAR 12/03/2019 1433   LABSPEC 1.010 12/03/2019 1433   PHURINE 7.0 12/03/2019 1433   GLUCOSEU NEGATIVE 12/03/2019 1433   HGBUR NEGATIVE 12/03/2019 1433   HGBUR negative 02/15/2009 1545   [place] 12/03/2019 1433   KETONESUR NEGATIVE 12/03/2019 1433   PROTEINUR NEGATIVE 12/03/2019 1433   UROBILINOGEN 1.0 11/12/2012 1039   NITRITE NEGATIVE 12/03/2019 1433   LEUKOCYTESUR LARGE (A) 12/03/2019 1433    Radiological Exams on Admission: DG Chest Portable 1 View  Result Date: 12/03/2019 CLINICAL DATA:  Hypotension, dizziness EXAM: PORTABLE CHEST 1 VIEW COMPARISON:  09/02/2019 FINDINGS: Cardiac silhouette appears mildly enlarged, although is partially obscured. Atherosclerotic calcification of  the aortic knob. Low lung volumes with mild vascular congestion. No focal airspace consolidation. No large pleural fluid collection. No pneumothorax. IMPRESSION: Low lung volumes with mild vascular congestion. Electronically Signed   By: Davina Poke D.O.   On: 12/03/2019 16:01   DG Chest Portable 1 View  Final Result    Consults called:  N/a  EKG: Independently reviewed. Afib    Dwyane Dee, MD [facility] Pager: Secure chat via [place] pager # : [phone]  If 7PM-7AM, please contact night-coverage [email or web address] Use universal [place] password for that web site. If you do not have the password, please call the hospital operator.  12/03/2019, 8:26 PM

## 2019-12-03 NOTE — ED Triage Notes (Signed)
Pt states he was sent from PCP (via phone advice) for low BP taken at home-states he has been dizzy 1-2 days-NAD-to triage in w/c

## 2019-12-03 NOTE — Hospital Course (Addendum)
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 He had called his PCP with his low BP and sxms and was told to present to come to the hospital.  Of note, he was also seen in the office on 11/23/2019.  At that time, due to complaints of lightheadedness he was taken off of his amlodipine and continued on Bumex, metolazone, hydralazine, losartan, and metoprolol. He still notes having low blood pressures at home and due to his symptoms today he presented for further evaluation.  On work-up at urgent care, his blood pressure was 115/65 followed by 96/55.  He also underwent orthostatic blood pressure check which was positive.  Other notable labs include: creatinine 3.08, BUN 66 WBC 8.3, no neutrophilia or bandemia UA notable for large leukocytes, 21-50 WBC, many bacteria.  Patient making normal urine with no dysuria, frequency, hesitancy.  Lactic acid 1.5  Due to his orthostasis, acute on chronic renal failure, and questionable infection, he was transferred for further treatment and evaluation. Furthermore, he has denied fevers, chills, chest pain, worsening shortness of breath.  Of note, he does use BiPAP at night for his OSA per his wife who is bedside.  She also states that he is supposed to wear his nasal cannula oxygen more during the day however he does not always do this.

## 2019-12-03 NOTE — Plan of Care (Signed)
Andre Hunter 2045-10-06  Asked by the PA Pati Gallo to admit for AKI. Baseline Cr is 2.00 and has CKD and repeat this AM was 3.08. Patient presented with Intermittent Dizziness for 3 weeks and no CT head done. Only given 500 mL in the ED because of worry for volume overloading the patient but no CXR done. Deferred Admission due to incomplete workup as no CXR, Head CT, COVID Test, or UA or Urine Cx done prior to calling for admission. Patient not vomiting and tolerating po. Has a Hx of COPD, Melenoma, A Fib, DM, HLD. Recently started on Bumex and on Several Nephrotoxic Meds. Need to check orthostatics as well.  Declined Admission for now until further workup is initiated

## 2019-12-03 NOTE — Plan of Care (Signed)
Called by [facility]   Patient presented with dizziness, acute on chronic kidney disease, on Bumex, lisinopril, losartan, metolazone.  Recently was started on Bumex  Baseline creatinine 1.9- 2.0, presented with creatinine of 3.08.  No nausea vomiting diarrhea, fevers or chills.  No acute focal neurological deficits No acute symptoms of cystitis but UA with positive leukocytes, many bacteria, WBCs 21-50  + orthostatic   -Accepted for observation, telemetry, hold all nephrotoxic medications -Continue IV fluid hydration, follow blood cultures, urine cultures   Ysmael Hires M.D. [facility] 12/03/2019, 5:01 PM

## 2019-12-03 NOTE — ED Provider Notes (Signed)
[facility] Provider Note   CSN: [id number] Arrival date & time: 12/03/19  1153     History Chief Complaint  Patient presents with  . Hypotension    Andre Hunter is a 74 y.o. male.  HPI  Patient is 74 year old male with history of chronic A. fib, COPD, DM two, HTN, HLD, renal disease, agent orange exposure.  Currently on Eliquis for his A. Fib.  Also with history of malignant melanoma although he states last PET scan was reassuring.   Patient presented today for dizziness for the past 3 weeks.  He states that it is worse today and he called his PCP who recommended ED evaluation for concern for hypotension VS hypoglycemia.  Patient states that he does occasionally have episodes of feeling lightheaded however he states that these have not changed in severity or frequency.  He states that he is primarily here for the sensation of dizziness.  He is also chronically short of breath denies any worsening shortness breath today.  Patient denies any fevers, chills, nausea, vomiting, diarrhea.  States that he tries to regularly drink water however he states that he feels that he may not have done the best job of staying hydrated.  He states that he is making urine.  He is followed by [facility] nephrology for his DM by Dr. Gardner Candle, MD.   Patient was recently changed his diuretic medications and is currently on Bumex, losartan, metolazone.      Past Medical History:  Diagnosis Date  . Cellulitis   . COPD (chronic obstructive pulmonary disease) ([place])   . Diabetes mellitus, type II, insulin dependent ([place])   . History of agent Orange exposure   . Hyperlipidemia   . Hypertension   . Renal disorder   . Renal failure, acute ([place])   . Sleep apnea   . Venous stasis dermatitis     Patient Active Problem List   Diagnosis Date Noted  . AKI (acute kidney injury) ([place]) 12/03/2019  . Venous stasis dermatitis, left 12/31/2011  . Dyspnea on  exertion 01/14/2011  . PVD (peripheral vascular disease) ([place]) 12/10/2010  . Venous stasis dermatitis 12/10/2010  . Onychomycosis 12/10/2010  . EDEMA 12/15/2008  . UNSPECIFIED ANEMIA 11/14/2008  . Recurrent cellulitis of lower leg 10/12/2008  . NEVI, MULTIPLE 05/12/2008  . DIABETES MELLITUS, TYPE II 09/09/2007  . HYPERLIPIDEMIA 09/09/2007  . OBESITY 09/09/2007  . HYPERTENSION 09/09/2007  . ASTHMA 09/09/2007  . GERD 09/09/2007    Past Surgical History:  Procedure Laterality Date  . APPENDECTOMY         No family history on file.  Social History   Tobacco Use  . Smoking status: Former Smoker    Types: Cigarettes  . Smokeless tobacco: Never Used  Substance Use Topics  . Alcohol use: Not Currently  . Drug use: No    Home Medications Prior to Admission medications   Medication Sig Start Date End Date Taking? Authorizing Provider  ALBUTEROL SULFATE IN Inhale 2 puffs into the lungs as needed.   Yes [provider]  apixaban (ELIQUIS) 5 MG TABS tablet Take by mouth. 08/25/19  Yes [provider]  atorvastatin (LIPITOR) 20 MG tablet Take 20 mg by mouth daily.   Yes [provider]  budesonide-formoterol (SYMBICORT) 160-4.5 MCG/ACT inhaler Inhale 2 puffs into the lungs 2 (two) times daily.   Yes [provider]  bumetanide (BUMEX) 1 MG tablet Take 1 mg by mouth 2 (two) times daily.  0900 and 2 pm   Yes [provider]  fish oil-omega-3 fatty acids 1000 MG capsule Take 2 g by mouth daily.     Yes [provider]  gabapentin (NEURONTIN) 300 MG capsule Take 900 mg by mouth daily.   Yes [provider]  hydrALAZINE (APRESOLINE) 50 MG tablet Take 50 mg by mouth in the morning and at bedtime.   Yes [provider]  hydrOXYzine (ATARAX/VISTARIL) 10 MG tablet Take 10 mg by mouth 3 (three) times daily.   Yes [provider]  insulin lispro (HUMALOG) 100 UNIT/ML injection Inject into the skin 3 (three) times  daily before meals.     Yes [provider]  latanoprost (XALATAN) 0.005 % ophthalmic solution 1 drop at bedtime. Both eyes   Yes [provider]  lisinopril (PRINIVIL,ZESTRIL) 10 MG tablet Take 10 mg by mouth daily.     Yes [provider]  losartan (COZAAR) 100 MG tablet Take 100 mg by mouth daily.   Yes [provider]  metolazone (ZAROXOLYN) 2.5 MG tablet Take 2.5 mg by mouth every other day. Odd days   Yes [provider]  metoprolol tartrate (LOPRESSOR) 25 MG tablet Take 25 mg by mouth 2 (two) times daily.   Yes [provider]  montelukast (SINGULAIR) 10 MG tablet Take 10 mg by mouth at bedtime.   Yes [provider]  timolol (BETIMOL) 0.25 % ophthalmic solution 1-2 drops 2 (two) times daily.     Yes [provider]  traMADol (ULTRAM) 50 MG tablet Take by mouth daily.   Yes [provider]  mometasone (ASMANEX) 220 MCG/INH inhaler Inhale 2 puffs into the lungs 2 (two) times daily.    [provider]    Allergies    Doxycycline  Review of Systems   Review of Systems  Constitutional: Negative for chills and fever.  HENT: Negative for congestion.   Eyes: Negative for pain.  Respiratory: Negative for cough and shortness of breath.   Cardiovascular: Negative for chest pain and leg swelling.  Gastrointestinal: Negative for abdominal pain and vomiting.  Genitourinary: Negative for dysuria.  Musculoskeletal: Negative for myalgias.  Skin: Negative for rash.  Neurological: Positive for dizziness. Negative for seizures, syncope, numbness and headaches.    Physical Exam Updated Vital Signs BP 112/86   Pulse 69   Temp 97.6 F (36.4 C) (Oral)   Resp (!) 22   Ht 5\' 5"  (1.651 m)   Wt 121.6 kg   SpO2 100%   BMI 44.60 kg/m   Physical Exam Vitals and nursing note reviewed.  Constitutional:      General: He is not in acute distress.    Comments: Patient is obese, pleasant, 74 year old male in no  acute distress.  HENT:     Head: Normocephalic and atraumatic.     Nose: Nose normal.  Eyes:     General: No scleral icterus. Cardiovascular:     Rate and Rhythm: Normal rate and regular rhythm.     Pulses: Normal pulses.     Heart sounds: Normal heart sounds.  Pulmonary:     Effort: Pulmonary effort is normal. No respiratory distress.     Breath sounds: No wheezing.     Comments: Speaking in full sentences with no accessory muscle usage, no tachypnea on my exam.  Lungs are clear to auscultation all fields. Abdominal:     Palpations: Abdomen is soft.     Tenderness: There is no abdominal tenderness. There is  no guarding or rebound.     Comments: Obese, nontender abdomen.  Musculoskeletal:     Cervical back: Normal range of motion.     Right lower leg: No edema.     Left lower leg: No edema.  Skin:    General: Skin is warm and dry.     Capillary Refill: Capillary refill takes less than 2 seconds.  Neurological:     Mental Status: He is alert. Mental status is at baseline.  Psychiatric:        Mood and Affect: Mood normal.        Behavior: Behavior normal.     ED Results / Procedures / Treatments   Labs (all labs ordered are listed, but only abnormal results are displayed) Labs Reviewed  COMPREHENSIVE METABOLIC PANEL - Abnormal; Notable for the following components:      Result Value   BUN 66 (*)    Creatinine, Ser 3.08 (*)    ALT 46 (*)    GFR calc non Af Amer 19 (*)    GFR calc Af Amer 22 (*)    All other components within normal limits  URINALYSIS, ROUTINE W REFLEX MICROSCOPIC - Abnormal; Notable for the following components:   Leukocytes,Ua LARGE (*)    All other components within normal limits  URINALYSIS, MICROSCOPIC (REFLEX) - Abnormal; Notable for the following components:   Bacteria, UA MANY (*)    All other components within normal limits  SARS CORONAVIRUS 2 (TAT 6-24 HRS)  CULTURE, BLOOD (ROUTINE X 2)  CULTURE, BLOOD (ROUTINE X 2)  URINE CULTURE  CBC  WITH DIFFERENTIAL/PLATELET  LACTIC ACID, PLASMA  CBG MONITORING, ED  CBG MONITORING, ED      EKG EKG Interpretation  Date/Time:  Friday Dec 03 2019 13:09:25 EDT Ventricular Rate:  140 PR Interval:    QRS Duration: 148 QT Interval:  386 QTC Calculation: 503 R Axis:   36 Text Interpretation: Atrial fibrillation Right bundle branch block No STEMI Confirmed by Nanda Quinton [phone]) on 12/03/2019 1:11:45 PM Also confirmed by Nanda Quinton [phone]), editor Hattie Perch ([id number])  on 12/03/2019 4:38:07 PM   Radiology DG Chest Portable 1 View  Result Date: 12/03/2019 CLINICAL DATA:  Hypotension, dizziness EXAM: PORTABLE CHEST 1 VIEW COMPARISON:  09/02/2019 FINDINGS: Cardiac silhouette appears mildly enlarged, although is partially obscured. Atherosclerotic calcification of the aortic knob. Low lung volumes with mild vascular congestion. No focal airspace consolidation. No large pleural fluid collection. No pneumothorax. IMPRESSION: Low lung volumes with mild vascular congestion. Electronically Signed   By: Davina Poke D.O.   On: 12/03/2019 16:01    Procedures Procedures (including critical care time)  Medications Ordered in ED Medications  cefTRIAXone (ROCEPHIN) 1 g in sodium chloride 0.9 % 100 mL IVPB (has no administration in time range)  0.9 %  sodium chloride infusion (has no administration in time range)  sodium chloride 0.9 % bolus 500 mL (0 mLs Intravenous Stopped 12/03/19 1502)    ED Course  I have reviewed the triage vital signs and the nursing notes.  Pertinent labs & imaging results that were available during my care of the patient were reviewed by me and considered in my medical decision making (see chart for details).  Patient is 74 year old male with past medical history detailed above presented for dizziness that is been ongoing for several weeks.   Physical exam is unremarkable the patient is chronically ill-appearing and has obese abdomen and is on oxygen at  baseline for his COPD.  Patient's  oxygen saturations dropped while ambulating from triage because he was off his baseline 3 L of oxygen.  He denies any dyspnea at that time.  States that he felt fine and oxygen saturation is returned to completely normal once placed back on his home oxygen.  We will obtain CMP, CBC and EKG and reevaluate patient.  Clinical Course as of Dec 02 1699  Fri Dec 03, 2019  1347 Initial CBG somewhat low at 64.  Will provide patient with food and recheck.   [WF]  [id number] Repeat CBG 85   [WF]  1615 CMP with creatinine of 3.08.  Baseline is 2.  BUN 66.  Patient meets rifle criteria for AKI.  No other acute electrolyte abnormalities.   [WF]  [id number] CBC without leukocytosis or anemia.   [WF]  1616 Urinalysis shows many bacteria and large leukocytes however patient has no urinary symptoms.  He is afebrile not tachycardic.   [WF]  1616 Lactic acid within normal limits.  No indication for repeat.   [WF]  [id number] Blood cultures pending-these were requested by Dr. Alfredia Ferguson before admission  Blood culture (routine x 2) [WF]  [id number] Chest x-ray without any evidence of infiltrate or acute abnormality.  There is mild pulmonary vascular congestion.  DG Chest Portable 1 View [WF]    Clinical Course User Index [WF] Tedd Sias, [place]   I discussed this case with my attending physician who cosigned this note including patient's presenting symptoms, physical exam, and planned diagnostics and interventions. Attending physician stated agreement with plan or made changes to plan which were implemented.   Attending physician assessed patient at bedside.  Agree with plan to admit patient to hospital.  --Blood cultures obtained and pending.  Lactic within normal limit no indication to repeat this.  Chest x-ray without any acute abnormality apart from some mild pulmonary vascular congestion.  Discussed with Dr. Claybon Jabs who requested blood cultures and CXR prior to admission and declined  admission until these result.  This hospital is recommended reconsulting for admission after this additional request have been completed.  Discussed with Dr. Tana Coast who will admit patient to hospital. Appreciate her expert consultation. She recommends gentle IV fluids which are ordered at 50 mL/Hr continuously for 1 day. Pt w recent echo w/ normal EF so will gently hydrate and reevaluate. Dr. Tana Coast recommended 1 dose of rocephin due to equivical UA. She will follow up on culture results and continue/discontinue appropriately.    MDM Rules/Calculators/A&P                      On my review of EMR patient had TTE echocardiogram done in February 2021 which showed normal EF 60-65%.   Final Clinical Impression(s) / ED Diagnoses Final diagnoses:  AKI (acute kidney injury) [facility])    Rx / DC Orders ED Discharge Orders    None       Tedd Sias, [place] 12/03/19 1701    Margette Fast, MD 12/05/19 [phone]

## 2019-12-03 NOTE — ED Notes (Signed)
[facility] notified (Andre Hunter) - patient ready for transport

## 2019-12-03 NOTE — ED Notes (Signed)
Attempted to call report, nurse not available.

## 2019-12-03 NOTE — ED Notes (Signed)
ED Provider at bedside. 

## 2019-12-04 DIAGNOSIS — I951 Orthostatic hypotension: Secondary | ICD-10-CM | POA: Diagnosis not present

## 2019-12-04 DIAGNOSIS — I129 Hypertensive chronic kidney disease with stage 1 through stage 4 chronic kidney disease, or unspecified chronic kidney disease: Secondary | ICD-10-CM | POA: Diagnosis not present

## 2019-12-04 LAB — CBC WITH DIFFERENTIAL/PLATELET
Abs Immature Granulocytes: 0.06 10*3/uL (ref 0.00–0.07)
Basophils Absolute: 0 10*3/uL (ref 0.0–0.1)
Basophils Relative: 1 %
Eosinophils Absolute: 0.2 10*3/uL (ref 0.0–0.5)
Eosinophils Relative: 2 %
HCT: 48.4 % (ref 39.0–52.0)
Hemoglobin: 15.7 g/dL (ref 13.0–17.0)
Immature Granulocytes: 1 %
Lymphocytes Relative: 13 %
Lymphs Abs: 1 10*3/uL (ref 0.7–4.0)
MCH: 31.7 pg (ref 26.0–34.0)
MCHC: 32.4 g/dL (ref 30.0–36.0)
MCV: 97.8 fL (ref 80.0–100.0)
Monocytes Absolute: 0.9 10*3/uL (ref 0.1–1.0)
Monocytes Relative: 12 %
Neutro Abs: 5.3 10*3/uL (ref 1.7–7.7)
Neutrophils Relative %: 71 %
Platelets: 162 10*3/uL (ref 150–400)
RBC: 4.95 MIL/uL (ref 4.22–5.81)
RDW: 14.3 % (ref 11.5–15.5)
WBC: 7.4 10*3/uL (ref 4.0–10.5)
nRBC: 0 % (ref 0.0–0.2)

## 2019-12-04 LAB — BASIC METABOLIC PANEL
Anion gap: 10 (ref 5–15)
BUN: 64 mg/dL — ABNORMAL HIGH (ref 8–23)
CO2: 32 mmol/L (ref 22–32)
Calcium: 8.9 mg/dL (ref 8.9–10.3)
Chloride: 101 mmol/L (ref 98–111)
Creatinine, Ser: 2.57 mg/dL — ABNORMAL HIGH (ref 0.61–1.24)
GFR calc Af Amer: 27 mL/min — ABNORMAL LOW (ref >60)
GFR calc non Af Amer: 23 mL/min — ABNORMAL LOW (ref >60)
Glucose, Bld: 139 mg/dL — ABNORMAL HIGH (ref 70–99)
Potassium: 3.7 mmol/L (ref 3.5–5.1)
Sodium: 143 mmol/L (ref 135–145)

## 2019-12-04 LAB — MAGNESIUM: Magnesium: 2.1 mg/dL (ref 1.7–2.4)

## 2019-12-04 LAB — GLUCOSE, CAPILLARY
Glucose-Capillary: 113 mg/dL — ABNORMAL HIGH (ref 70–99)
Glucose-Capillary: 189 mg/dL — ABNORMAL HIGH (ref 70–99)

## 2019-12-04 LAB — HEMOGLOBIN A1C
Hgb A1c MFr Bld: 6.5 % — ABNORMAL HIGH (ref 4.8–5.6)
Mean Plasma Glucose: 139.85 mg/dL

## 2019-12-04 LAB — BRAIN NATRIURETIC PEPTIDE: B Natriuretic Peptide: 119.2 pg/mL — ABNORMAL HIGH (ref 0.0–100.0)

## 2019-12-04 MED ORDER — BUMETANIDE 1 MG PO TABS: ORAL_TABLET | ORAL | Status: AC

## 2019-12-04 MED ORDER — GABAPENTIN 300 MG PO CAPS: 300.0000 mg | ORAL_CAPSULE | Freq: Every evening | ORAL | Status: AC

## 2019-12-04 NOTE — Discharge Summary (Signed)
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 He had called his PCP with his low BP and sxms and was told to present to come to the hospital.  Of note, he was also seen in the office on 11/23/2019.  At that time, due to complaints of lightheadedness he was taken off of his amlodipine and continued on Bumex, metolazone, hydralazine, losartan, and metoprolol. He still notes having low blood pressures at home and due to his symptoms today he presented for further evaluation.  On work-up at urgent care, his blood pressure was 115/65 followed by 96/55.  He also underwent orthostatic blood pressure check which was positive.  Other notable labs include: creatinine 3.08, BUN 66 WBC 8.3, no neutrophilia or bandemia UA notable for large leukocytes, 21-50 WBC, many bacteria.  Patient making normal urine with no dysuria, frequency, hesitancy.  Lactic acid 1.5  Hospital course: His hospital course remained stable.  He was started on gentle IV fluids on admission.  AKI has improved with IV fluids and his creatinine today is close to his baseline.  He was not orthostatic this morning.  He walked with the physical therapy and he was recommended home health PT on discharge.  We recommend to discontinue losartan and metolazone for now.  He can resume Bumex from Monday.  We recommend to follow-up with his PCP and do a BMP test in a week.  He needs  to follow-up with his nephrologist also.  Patient is hemodynamically stable for discharge home today.   Following problems were addressed during hospitalization:  Orthostatic hypotension - appears to be due to overmedication from his antihypertensive regimen.   Med list reviewed with patient and his wife bedside on admission and he is however still taking losartan, metoprolol, hydralazine, Bumex, metolazone -Orthostatic hypotension resolved today.  Actually he is hypertensive this morning.  He denies any dizziness or lightheadedness on standing -Physical therapy recommended home health on discharge. -Discontinue metolazone and losartan on discharge.  Continue metoprolol and hydralazine.  Acute on chronic kidney disease, stage III -Follows outpatient with nephrology at [place] is over diuresed as he was recently taken off of Lasix and started on Bumex twice daily -Held Bumex and metolazone.  Recent echo was performed on 09/21/2019 with normal EF, 60 to 65% and indeterminate filling pattern; his swelling in his legs are now minimal and back to his baseline he states -Started on IV fluids.  Creatinine 2.5.  His baseline creatinine is around 2.  Check BMP in a week -We recommend to discontinue losartan and metolazone on discharge -follow-up with nephrology as an outpatient  Asymptomatic bacteriuria -Patient is not septic nor toxic appearing.  -Lactic acid normal, afebrile, no leukocytosis -Received 1 dose Rocephin at [facility] -No further antibiotics nor workup indicated  Hypertension -Blood pressure on the upper side this morning.  Continues on the home medications  Chronic afib - continue Eliquis -Monitor on telemetry  OSA Chronic hypoxic respiratory failure COPD - continue home BiPAP  - wife states he  is supposed to be on 3L [place] during the day mostly also  Insomnia -Patient takes tramadol at night to help with sleep as well as some neuropathy pain he states.   -Continue tramadol -Gabapentin at home.  Will recommend to reduce the dose to 300 mg daily.  Type 2 diabetes -Continue home regimen.Hemoglobin A1c of 6.5.  Discharge Diagnoses:  Principal Problem:   Orthostatic hypotension Active Problems:   Type 2 diabetes mellitus with stage 3 chronic kidney disease (HCC)   Acute on chronic kidney failure (HCC)   OSA (obstructive sleep apnea)    Discharge Instructions  Discharge Instructions    Diet - low sodium heart healthy   Complete by: As directed    Discharge instructions   Complete by: As directed    1)Please follow up with your PCP in a week.Do a Bmp test during the follow up. 2)We recommend to stop taking losartan and metolazone for now.  Please follow-up with your nephrologist in a week.  Resumption of those meds will depend upon your nephrologist evaluation.   Increase activity slowly   Complete by: As directed      Allergies as of 12/04/2019      Reactions   Lisinopril Cough   Doxycycline Nausea And Vomiting, Rash      Medication List    STOP taking these medications   losartan 100 MG tablet Commonly known as: COZAAR   metolazone 2.5 MG tablet Commonly known as: ZAROXOLYN     TAKE these medications   ALBUTEROL SULFATE IN Inhale 2 puffs into the lungs daily as needed (shortness of breath).   apixaban 5 MG Tabs tablet Commonly known as: ELIQUIS Take 5 mg by mouth 2 (two) times daily.   atorvastatin 20 MG tablet Commonly known as: LIPITOR Take 20 mg by mouth daily.   budesonide-formoterol 160-4.5 MCG/ACT inhaler Commonly known as: SYMBICORT Inhale 2 puffs into the lungs 2 (two) times daily.   bumetanide 1 MG tablet Commonly known as: BUMEX 0900 and 2 pm  Resume only on Monday (12/06/19) What changed:   how much to take  how to take this  when to take this  additional instructions   fish oil-omega-3 fatty acids 1000 MG capsule Take 2 g by mouth daily.   gabapentin 300 MG capsule Commonly known  as: NEURONTIN Take 1 capsule (300 mg total) by mouth every evening. What changed: how much to take   hydrALAZINE 50 MG tablet Commonly known as: APRESOLINE Take 50 mg by mouth in the morning and at bedtime.   hydrOXYzine 10 MG tablet Commonly known as: ATARAX/VISTARIL Take 10 mg by mouth 3 (three) times daily.   insulin aspart 100 UNIT/ML injection Commonly known as: novoLOG Inject 10-15 Units into the skin 3 (three) times daily before meals.   latanoprost 0.005 % ophthalmic solution Commonly known as: XALATAN 1 drop at bedtime. Both eyes   metoprolol tartrate 25 MG tablet Commonly known as: LOPRESSOR Take 25 mg by mouth 2 (two) times daily.   mometasone 220 MCG/INH inhaler Commonly known as: ASMANEX Inhale 2 puffs into the lungs 2 (two) times daily.   montelukast 10 MG tablet Commonly known as: SINGULAIR Take 10 mg by mouth at bedtime.   NovoLIN N 100 UNIT/ML injection Generic drug: insulin NPH Human Inject 35-40 Units into the skin every evening.   timolol 0.25 % ophthalmic solution Commonly known as: BETIMOL 1-2 drops 2 (two) times daily.   traMADol 50 MG tablet Commonly known as: ULTRAM Take  50 mg by mouth at bedtime as needed for moderate pain.      Follow-up Information    Imagene Riches, MD. Schedule an appointment as soon as possible for a visit in 1 week(s).   Specialty: Internal Medicine Contact information: [place] [address] [phone]        Tracie Harrier, MD. Schedule an appointment as soon as possible for a visit in 1 week(s).   Contact information: [id number] [place] [address] [phone]          Allergies  Allergen Reactions  . Lisinopril Cough  . Doxycycline Nausea And Vomiting and Rash    Consultations:  None   Procedures/Studies: DG Chest Portable 1 View  Result Date: 12/03/2019 CLINICAL DATA:  Hypotension, dizziness EXAM: PORTABLE CHEST 1 VIEW COMPARISON:   09/02/2019 FINDINGS: Cardiac silhouette appears mildly enlarged, although is partially obscured. Atherosclerotic calcification of the aortic knob. Low lung volumes with mild vascular congestion. No focal airspace consolidation. No large pleural fluid collection. No pneumothorax. IMPRESSION: Low lung volumes with mild vascular congestion. Electronically Signed   By: Davina Poke D.O.   On: 12/03/2019 16:01      Subjective: Patient seen and examined at the bedside this morning.  Not orthostatic anymore.  Denies any dizziness or lightheadedness.  Stable for discharge.  Discharge Exam: Vitals:   12/04/19 0810 12/04/19 0925  BP:    Pulse:    Resp:    Temp:    SpO2: 92% (!) 88%   Vitals:   12/04/19 0208 12/04/19 0550 12/04/19 0810 12/04/19 0925  BP: 129/79 (!) 158/80    Pulse: 78 66    Resp: 18 20    Temp: 97.6 F (36.4 C) 97.7 F (36.5 C)    TempSrc:      SpO2: 94% 90% 92% (!) 88%  Weight:      Height:        General: Pt is alert, awake, not in acute distress Cardiovascular: RRR, S1/S2 +, no rubs, no gallops Respiratory: CTA bilaterally, no wheezing, no rhonchi Abdominal: Soft, NT, ND, bowel sounds + Extremities: no edema, no cyanosis    The results of significant diagnostics from this hospitalization (including imaging, microbiology, ancillary and laboratory) are listed below for reference.     Microbiology: Recent Results (from the past 240 hour(s))  SARS CORONAVIRUS 2 (TAT 6-24 HRS) Nasopharyngeal Nasopharyngeal Swab     Status: None   Collection Time: 12/03/19  2:31 PM   Specimen: Nasopharyngeal Swab  Result Value Ref Range Status   SARS Coronavirus 2 NEGATIVE NEGATIVE Final    Comment: (NOTE) SARS-CoV-2 target nucleic acids are NOT DETECTED. The SARS-CoV-2 RNA is generally detectable in upper and lower respiratory specimens during the acute phase of infection. Negative results do not preclude SARS-CoV-2 infection, do not rule out co-infections with other  pathogens, and should not be used as the sole basis for treatment or other patient management decisions. Negative results must be combined with clinical observations, patient history, and epidemiological information. The expected result is Negative. Fact Sheet for Patients: SugarRoll.be Fact Sheet for Healthcare Providers: https://www.woods-mathews.com/ This test is not yet approved or cleared by the [place] FDA and  has been authorized for detection and/or diagnosis of SARS-CoV-2 by FDA under an Emergency Use Authorization (EUA). This EUA will remain  in effect (meaning this test can be used) for the duration of the COVID-19 declaration under Section 56 4(b)(1) of the Act,  21 U.S.C. section 360bbb-3(b)(1), unless the authorization is terminated or revoked sooner. Performed at [facility], [place] [address], [place], [place] [address]   Blood culture (routine x 2)     Status: None (Preliminary result)   Collection Time: 12/03/19  3:00 PM   Specimen: BLOOD  Result Value Ref Range Status   Specimen Description BLOOD RIGHT ANTECUBITAL  Final   Special Requests   Final    BOTTLES DRAWN AEROBIC AND ANAEROBIC Blood Culture adequate volume Performed at [facility], [address] [place], [place] [address]    Culture   Final    NO GROWTH < 12 HOURS Performed at [facility], [place] [address] [place], [place] [address]    Report Status PENDING  Incomplete  Blood culture (routine x 2)     Status: None (Preliminary result)   Collection Time: 12/03/19  3:16 PM   Specimen: BLOOD RIGHT HAND  Result Value Ref Range Status   Specimen Description   Final    BLOOD RIGHT HAND Performed at [facility], [address] [place], [place] [address]    Special Requests   Final    BOTTLES DRAWN AEROBIC AND ANAEROBIC Blood Culture adequate volume Performed at [facility], [address] [place], [place]  [address]    Culture   Final    NO GROWTH < 12 HOURS Performed at [facility], [place] [address], [place],  [address]    Report Status PENDING  Incomplete     Labs: BNP (last 3 results) Recent Labs    12/04/19 0355  BNP [id number]*   Basic Metabolic Panel: Recent Labs  Lab 12/03/19 1305 12/04/19 0355  NA 142 143  K 3.6 3.7  CL 98 101  CO2 30 32  GLUCOSE 76 139*  BUN 66* 64*  CREATININE 3.08* 2.57*  CALCIUM 9.4 8.9  MG  --  2.1   Liver Function Tests: Recent Labs  Lab 12/03/19 1305  AST 27  ALT 46*  ALKPHOS 124  BILITOT 1.1  PROT 6.8  ALBUMIN 3.5   No results for input(s): LIPASE, AMYLASE in the last 168 hours. No results for input(s): AMMONIA in the last 168 hours. CBC: Recent Labs  Lab 12/03/19 1305 12/04/19 0355  WBC 8.3 7.4  NEUTROABS 6.1 5.3  HGB 16.8 15.7  HCT 50.7 48.4  MCV 94.9 97.8  PLT 167 162   Cardiac Enzymes: No results for input(s): CKTOTAL, CKMB, CKMBINDEX, TROPONINI in the last 168 hours. BNP: Invalid input(s): POCBNP CBG: Recent Labs  Lab 12/03/19 1412 12/03/19 2030 12/03/19 2211 12/04/19 0749 12/04/19 1117  GLUCAP 84 189* 182* 113* 189*   D-Dimer No results for input(s): DDIMER in the last 72 hours. Hgb A1c Recent Labs    12/04/19 0355  HGBA1C 6.5*   Lipid Profile No results for input(s): CHOL, HDL, LDLCALC, TRIG, CHOLHDL, LDLDIRECT in the last 72 hours. Thyroid function studies No results for input(s): TSH, T4TOTAL, T3FREE, THYROIDAB in the last 72 hours.  Invalid input(s): FREET3 Anemia work up No results for input(s): VITAMINB12, FOLATE, FERRITIN, TIBC, IRON, RETICCTPCT in the last 72 hours. Urinalysis    Component Value Date/Time   COLORURINE YELLOW 12/03/2019 1433   APPEARANCEUR CLEAR 12/03/2019 1433   LABSPEC 1.010 12/03/2019 1433   PHURINE 7.0 12/03/2019 1433   GLUCOSEU NEGATIVE 12/03/2019 1433   HGBUR NEGATIVE 12/03/2019 1433   HGBUR negative 02/15/2009 1545   BILIRUBINUR  NEGATIVE 12/03/2019  [place] 12/03/2019 1433   PROTEINUR NEGATIVE 12/03/2019 1433   UROBILINOGEN 1.0 11/12/2012 1039   NITRITE NEGATIVE 12/03/2019 1433   LEUKOCYTESUR LARGE (A) 12/03/2019 1433   Sepsis Labs Invalid input(s): PROCALCITONIN,  WBC,  LACTICIDVEN Microbiology Recent Results (from the past 240 hour(s))  SARS CORONAVIRUS 2 (TAT 6-24 HRS) Nasopharyngeal Nasopharyngeal Swab     Status: None   Collection Time: 12/03/19  2:31 PM   Specimen: Nasopharyngeal Swab  Result Value Ref Range Status   SARS Coronavirus 2 NEGATIVE NEGATIVE Final    Comment: (NOTE) SARS-CoV-2 target nucleic acids are NOT DETECTED. The SARS-CoV-2 RNA is generally detectable in upper and lower respiratory specimens during the acute phase of infection. Negative results do not preclude SARS-CoV-2 infection, do not rule out co-infections with other pathogens, and should not be used as the sole basis for treatment or other patient management decisions. Negative results must be combined with clinical observations, patient history, and epidemiological information. The expected result is Negative. Fact Sheet for Patients: SugarRoll.be Fact Sheet for Healthcare Providers: https://www.woods-mathews.com/ This test is not yet approved or cleared by the [place] FDA and  has been authorized for detection and/or diagnosis of SARS-CoV-2 by FDA under an Emergency Use Authorization (EUA). This EUA will remain  in effect (meaning this test can be used) for the duration of the COVID-19 declaration under Section 56 4(b)(1) of the Act, 21 U.S.C. section 360bbb-3(b)(1), unless the authorization is terminated or revoked sooner. Performed at [facility], [place] [address] [place], [place] [address]   Blood culture (routine x 2)     Status: None (Preliminary result)   Collection Time: 12/03/19  3:00 PM   Specimen: BLOOD  Result Value Ref Range Status   Specimen Description  BLOOD RIGHT ANTECUBITAL  Final   Special Requests   Final    BOTTLES DRAWN AEROBIC AND ANAEROBIC Blood Culture adequate volume Performed at [facility], [address] [place], [place] [address]    Culture   Final    NO GROWTH < 12 HOURS Performed at [facility], [place] [address], [place], [place] [address]    Report Status PENDING  Incomplete  Blood culture (routine x 2)     Status: None (Preliminary result)   Collection Time: 12/03/19  3:16 PM   Specimen: BLOOD RIGHT HAND  Result Value Ref Range Status   Specimen Description   Final    BLOOD RIGHT HAND Performed at [facility], [address] [place], [place] [address]    Special Requests   Final    BOTTLES DRAWN AEROBIC AND ANAEROBIC Blood Culture adequate volume Performed at [facility], [address] [place], [place] [address]    Culture   Final    NO GROWTH < 12 HOURS Performed at [facility], [place] [address] [place], [place] [address]    Report Status PENDING  Incomplete    Please note: You were cared for by a hospitalist during your hospital stay. Once you are discharged, your primary care physician will handle any further medical issues. Please note that NO REFILLS for any discharge medications will be authorized once you are discharged, as it is imperative that you return to your primary care physician (or establish a relationship with a primary care physician if you do not have one) for your post hospital discharge needs so that they can reassess your need  for medications and monitor your lab values.    Time coordinating discharge: 40 minutes  SIGNED:   Shelly Coss, MD  [facility] 12/04/2019, 1:41 PM Pager [phone]  If 7PM-7AM, please contact night-coverage [email or web address] Password [email or web address]

## 2019-12-04 NOTE — Progress Notes (Signed)
Patient refused CPAP for tonight but is wearing oxygen

## 2019-12-04 NOTE — Progress Notes (Signed)
Pt discharge to home instructions reviewed with wife and pt, acknowledged understanding. SRP, RN

## 2019-12-04 NOTE — TOC Initial Note (Signed)
Transition of Care [facility]) - Initial/Assessment Note    Patient Details  Name: Andre Hunter MRN: [id number] Date of Birth: [birth year]  Transition of Care [facility]) CM/SW Contact:    Joaquin Courts, RN Phone Number: 12/04/2019, 2:25 PM  Clinical Narrative:    Patient set up with [facility] for [place].  Patient reports he has all necessary dme at home and has a portable O2 tank for transport home.                Expected Discharge Plan: [place] Barriers to Discharge: No Barriers Identified   Patient Goals and CMS Choice Patient states their goals for this hospitalization and ongoing recovery are:: to go home today CMS Medicare.gov Compare Post Acute Care list provided to:: Patient Choice offered to / list presented to : Patient  Expected Discharge Plan and Services Expected Discharge Plan: [place] Choice: [place] arrangements for the past 2 months: Single Family Home Expected Discharge Date: 12/04/19               DME Arranged: N/A DME Agency: NA       HH Arranged: PT [place] Agency: [place] ([place]) Date HH Agency Contacted: 12/04/19 Time [place]: 33 Representative spoke with at [place]: Corene Cornea  Prior Living Arrangements/Services Living arrangements for the past 2 months: [place] Lives with:: Spouse Patient language and need for interpreter reviewed:: Yes Do you feel safe going back to the place where you live?: Yes      Need for Family Participation in Patient Care: Yes (Comment) Care giver support system in place?: Yes (comment)   Criminal Activity/Legal Involvement Pertinent to Current Situation/Hospitalization: No - Comment as needed  Activities of Daily Living Home Assistive Devices/Equipment: CBG Meter, Walker (specify type), Oxygen ADL Screening (condition at time of admission) Patient's cognitive ability adequate to safely complete daily activities?:  Yes Is the patient deaf or have difficulty hearing?: No Does the patient have difficulty seeing, even when wearing glasses/contacts?: No Does the patient have difficulty concentrating, remembering, or making decisions?: No Patient able to express need for assistance with ADLs?: Yes Does the patient have difficulty dressing or bathing?: No Independently performs ADLs?: Yes (appropriate for developmental age) Does the patient have difficulty walking or climbing stairs?: No Weakness of Legs: None Weakness of Arms/Hands: None  Permission Sought/Granted                  Emotional Assessment Appearance:: Appears stated age Attitude/Demeanor/Rapport: Engaged Affect (typically observed): Accepting Orientation: : Oriented to Self, Oriented to Place, Oriented to  Time, Oriented to Situation   Psych Involvement: No (comment)  Admission diagnosis:  AKI (acute kidney injury) ([place]) [N17.9] Orthostatic hypotension [I95.1] Patient Active Problem List   Diagnosis Date Noted  . Acute on chronic kidney failure ([place]) 12/03/2019  . Orthostatic hypotension 12/03/2019  . OSA (obstructive sleep apnea) 12/03/2019  . Venous stasis dermatitis, left 12/31/2011  . Dyspnea on exertion 01/14/2011  . PVD (peripheral vascular disease) ([place]) 12/10/2010  . Venous stasis dermatitis 12/10/2010  . Onychomycosis 12/10/2010  . EDEMA 12/15/2008  . UNSPECIFIED ANEMIA 11/14/2008  . Recurrent cellulitis of lower leg 10/12/2008  . NEVI, MULTIPLE 05/12/2008  . Type 2 diabetes mellitus with stage 3 chronic kidney disease ([place]) 09/09/2007  . HYPERLIPIDEMIA 09/09/2007  . OBESITY 09/09/2007  . HYPERTENSION 09/09/2007  . ASTHMA 09/09/2007  . GERD 09/09/2007  PCP:  Imagene Riches, MD Pharmacy:   [facility] [place], [place] [place] [address] Phone: [phone] Fax: [phone]     Social Determinants of Health (SDOH)  Interventions    Readmission Risk Interventions No flowsheet data found.

## 2019-12-04 NOTE — Evaluation (Addendum)
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  Clinical Impression   Pt presents with generalized weakness, dyspnea on exertion requiring supplemental O2 at baseline, impaired dynamic standing balance, and decreased activity tolerance limiting pt's mobility in the home environment. Pt to benefit from acute PT to address deficits. Pt ambulated short hallway distance this day with use of RW initially, and tolerated short distance without AD, with close guard for pt safety. Pt with no s/s dizziness on eval, and BP was stable during mobility (see orthostatics below). Pt and wife both agreeable to HHPT to maximize pt mobility and safety in the home environment.  PT to progress mobility as tolerated, and will continue to follow acutely.   Orthostatic vitals, BP and HR: - supine: 130/78 (85), 76 - sitting: 140/75(95), 86 - standing 0 min: 135/79(90), 61 -standing 3 min: 132/83(99), 120    Follow Up Recommendations Home health PT    Equipment Recommendations  None recommended by PT    Recommendations for Other Services       Precautions / Restrictions Precautions Precautions: Fall Precaution Comments: on 2-3LO2 PRN at home Restrictions Weight Bearing Restrictions: No      Mobility  Bed Mobility Overal bed mobility: Needs Assistance Bed Mobility: Supine to Sit;Sit to Supine     Supine to sit: Min assist;HOB elevated Sit to supine: Min assist;HOB elevated   General bed mobility comments: min assist for trunk elevation with pt use of PT HHA to pull up, lower down. Increased time and effort, really dislikes HOB flat due to respiratory  difficulties.  Transfers Overall transfer level: Needs assistance Equipment used: Rolling walker (2 wheeled) Transfers: Sit to/from Stand Sit to Stand: From elevated surface;Min assist         General transfer comment: min assist for initial power up from bed, verbal cuing for hand placement when rising to stand.  Ambulation/Gait Ambulation/Gait assistance: Min guard Gait Distance (Feet): 65 Feet Assistive device: None;Rolling walker (2 wheeled) Gait Pattern/deviations: Step-through pattern;Decreased stride length;Trunk flexed Gait velocity: decr   General Gait Details: Min guard for safety, verbal cuing for safe use of RW when using. Last 25 ft ambulation pt ambulating without use of AD, mildly unsteady but no overt LOB. SpO2 92% immediately post-ambulation on 3LO2.  Stairs            Wheelchair Mobility    Modified Rankin (Stroke Patients Only)       Balance Overall balance assessment: Needs assistance Sitting-balance support: No upper extremity supported;Feet supported Sitting balance-Leahy Scale: Good     Standing balance support: No upper extremity supported;During functional activity Standing balance-Leahy Scale: Fair Standing balance comment: cannot accept challenge without AD                             Pertinent Vitals/Pain Pain Assessment: No/denies pain Pain Intervention(s): Monitored during session    Home Living Family/patient expects to be discharged to:: Private residence Living Arrangements: Spouse/significant other Available Help at Discharge: Family Type of Home: House Home Access: Stairs to enter Entrance Stairs-Rails: None Entrance Stairs-Number of Steps: 2 Home Layout: Two level Home Equipment: Shower seat;Cane - single point;Grab bars -  tub/shower;Walker - 2 wheels      Prior Function Level of Independence: Independent with assistive device(s)         Comments: pt reports using cane as needed PTA, states he does  feel increasingly unsteady and needing to use cane more and more     Hand Dominance   Dominant Hand: Right    Extremity/Trunk Assessment   Upper Extremity Assessment Upper Extremity Assessment: Overall WFL for tasks assessed    Lower Extremity Assessment Lower Extremity Assessment: Generalized weakness    Cervical / Trunk Assessment Cervical / Trunk Assessment: Normal  Communication   Communication: No difficulties  Cognition Arousal/Alertness: Awake/alert Behavior During Therapy: WFL for tasks assessed/performed Overall Cognitive Status: Within Functional Limits for tasks assessed                                 General Comments: pleasant, motivated      General Comments      Exercises Other Exercises Other Exercises: Pt and family education: energy conservation techniques, breathing technique   Assessment/Plan    PT Assessment Patient needs continued PT services  PT Problem List Decreased strength;Decreased mobility;Cardiopulmonary status limiting activity;Decreased balance;Decreased knowledge of use of DME;Decreased activity tolerance;Obesity       PT Treatment Interventions DME instruction;Therapeutic activities;Gait training;Therapeutic exercise;Patient/family education;Balance training;Stair training;Functional mobility training;Neuromuscular re-education    PT Goals (Current goals can be found in the Care Plan section)  Acute Rehab PT Goals Patient Stated Goal: go home PT Goal Formulation: With patient Time For Goal Achievement: 12/18/19 Potential to Achieve Goals: Good    Frequency Min 3X/week   Barriers to discharge        Co-evaluation               AM-PAC PT "6 Clicks" Mobility  Outcome Measure Help needed turning from your back to your side while in a flat bed without using bedrails?: A Little Help needed moving from lying on your back to sitting on the side of a flat bed without using bedrails?: A Little Help needed  moving to and from a bed to a chair (including a wheelchair)?: A Little Help needed standing up from a chair using your arms (e.g., wheelchair or bedside chair)?: A Little Help needed to walk in hospital room?: A Little Help needed climbing 3-5 steps with a railing? : A Lot 6 Click Score: 17    End of Session Equipment Utilized During Treatment: Gait belt;Oxygen Activity Tolerance: Patient limited by fatigue;Other (comment)(DOE 3/4) Patient left: in bed;with call bell/phone within reach;with family/visitor present Nurse Communication: Mobility status PT Visit Diagnosis: Other abnormalities of gait and mobility (R26.89);Difficulty in walking, not elsewhere classified (R26.2)    Time: [id number] PT Time Calculation (min) (ACUTE ONLY): 28 min   Charges:   PT Evaluation $PT Eval Low Complexity: 1 Low PT Treatments $Gait Training: 8-22 mins        Anayeli Arel E, PT Acute Rehabilitation Services Pager [phone]  Office [phone]  Kinzleigh Kandler D Riad Wagley 12/04/2019, 2:00 PM

## 2019-12-04 NOTE — Plan of Care (Signed)
  Problem: Education: Goal: Knowledge of General Education information will improve Description Including pain rating scale, medication(s)/side effects and non-pharmacologic comfort measures Outcome: Progressing   

## 2019-12-06 LAB — CBG MONITORING, ED: Glucose-Capillary: 68 mg/dL — ABNORMAL LOW (ref 70–99)

## 2019-12-06 LAB — URINE CULTURE: Culture: >=100000 — AB

## 2019-12-08 LAB — CULTURE, BLOOD (ROUTINE X 2)
Culture: NO GROWTH
Culture: NO GROWTH
Special Requests: ADEQUATE
Special Requests: ADEQUATE

## 2022-08-29 DEATH — deceased
